# Patient Record
Sex: Male | Born: 1945 | Race: White | Hispanic: No | Marital: Married | Smoking: Never smoker
Health system: Southern US, Community
[De-identification: ages and names within clinical notes are randomized; demographics above are authoritative.]

## PROBLEM LIST (undated history)

## (undated) DIAGNOSIS — J45909 Unspecified asthma, uncomplicated: Secondary | ICD-10-CM

## (undated) DIAGNOSIS — E119 Type 2 diabetes mellitus without complications: Secondary | ICD-10-CM

## (undated) DIAGNOSIS — J189 Pneumonia, unspecified organism: Secondary | ICD-10-CM

## (undated) DIAGNOSIS — M79606 Pain in leg, unspecified: Secondary | ICD-10-CM

## (undated) DIAGNOSIS — Z8639 Personal history of other endocrine, nutritional and metabolic disease: Secondary | ICD-10-CM

## (undated) DIAGNOSIS — E7800 Pure hypercholesterolemia, unspecified: Secondary | ICD-10-CM

## (undated) DIAGNOSIS — Z9889 Other specified postprocedural states: Secondary | ICD-10-CM

## (undated) DIAGNOSIS — I10 Essential (primary) hypertension: Secondary | ICD-10-CM

## (undated) DIAGNOSIS — K219 Gastro-esophageal reflux disease without esophagitis: Secondary | ICD-10-CM

## (undated) HISTORY — DX: Other specified postprocedural states: Z98.890

## (undated) HISTORY — PX: LUNG SURGERY: SHX703

## (undated) HISTORY — PX: FRACTURE SURGERY: SHX138

## (undated) HISTORY — DX: Pain in leg, unspecified: M79.606

## (undated) HISTORY — DX: Pure hypercholesterolemia, unspecified: E78.00

## (undated) HISTORY — PX: COLONOSCOPY: SHX174

---

## 2010-04-17 DIAGNOSIS — E7800 Pure hypercholesterolemia, unspecified: Secondary | ICD-10-CM | POA: Insufficient documentation

## 2010-04-17 DIAGNOSIS — I10 Essential (primary) hypertension: Secondary | ICD-10-CM | POA: Insufficient documentation

## 2011-06-16 DIAGNOSIS — E1165 Type 2 diabetes mellitus with hyperglycemia: Secondary | ICD-10-CM | POA: Insufficient documentation

## 2013-01-12 DIAGNOSIS — J45909 Unspecified asthma, uncomplicated: Secondary | ICD-10-CM | POA: Insufficient documentation

## 2013-01-12 DIAGNOSIS — J869 Pyothorax without fistula: Secondary | ICD-10-CM | POA: Insufficient documentation

## 2013-09-06 ENCOUNTER — Encounter (HOSPITAL_COMMUNITY): Payer: Self-pay | Admitting: Pharmacy Technician

## 2013-09-08 ENCOUNTER — Encounter (HOSPITAL_COMMUNITY)
Admission: RE | Admit: 2013-09-08 | Discharge: 2013-09-08 | Disposition: A | Discharge status: Home / Self Care | Payer: Medicare Other | Source: Ambulatory Visit | Attending: Orthopedic Surgery | Admitting: Orthopedic Surgery

## 2013-09-08 ENCOUNTER — Ambulatory Visit (HOSPITAL_COMMUNITY)
Admission: RE | Admit: 2013-09-08 | Discharge: 2013-09-08 | Disposition: A | Discharge status: Home / Self Care | Payer: Medicare Other | Source: Ambulatory Visit | Attending: Orthopedic Surgery | Admitting: Orthopedic Surgery

## 2013-09-08 ENCOUNTER — Encounter (HOSPITAL_COMMUNITY): Payer: Self-pay

## 2013-09-08 DIAGNOSIS — Z01812 Encounter for preprocedural laboratory examination: Secondary | ICD-10-CM | POA: Insufficient documentation

## 2013-09-08 HISTORY — DX: Gastro-esophageal reflux disease without esophagitis: K21.9

## 2013-09-08 HISTORY — DX: Personal history of other endocrine, nutritional and metabolic disease: Z86.39

## 2013-09-08 HISTORY — DX: Essential (primary) hypertension: I10

## 2013-09-08 HISTORY — DX: Pneumonia, unspecified organism: J18.9

## 2013-09-08 HISTORY — DX: Unspecified asthma, uncomplicated: J45.909

## 2013-09-08 HISTORY — DX: Type 2 diabetes mellitus without complications: E11.9

## 2013-09-08 LAB — CBC
HCT: 37.4 % — ABNORMAL LOW (ref 39.0–52.0)
Hemoglobin: 12.9 g/dL — ABNORMAL LOW (ref 13.0–17.0)
MCH: 29.7 pg (ref 26.0–34.0)
MCHC: 34.5 g/dL (ref 30.0–36.0)
MCV: 86.2 fL (ref 78.0–100.0)
PLATELETS: 205 10*3/uL (ref 150–400)
RBC: 4.34 MIL/uL (ref 4.22–5.81)
RDW: 12.5 % (ref 11.5–15.5)
WBC: 9 10*3/uL (ref 4.0–10.5)

## 2013-09-08 LAB — BASIC METABOLIC PANEL
BUN: 19 mg/dL (ref 6–23)
CALCIUM: 9.2 mg/dL (ref 8.4–10.5)
CO2: 25 meq/L (ref 19–32)
Chloride: 99 mEq/L (ref 96–112)
Creatinine, Ser: 1.12 mg/dL (ref 0.50–1.35)
GFR calc Af Amer: 76 mL/min — ABNORMAL LOW (ref >90)
GFR, EST NON AFRICAN AMERICAN: 66 mL/min — AB (ref >90)
Glucose, Bld: 105 mg/dL — ABNORMAL HIGH (ref 70–99)
Potassium: 4.2 mEq/L (ref 3.7–5.3)
SODIUM: 139 meq/L (ref 137–147)

## 2013-09-08 LAB — SURGICAL PCR SCREEN
MRSA, PCR: NEGATIVE
Staphylococcus aureus: NEGATIVE

## 2013-09-08 LAB — TYPE AND SCREEN
ABO/RH(D): O POS
ANTIBODY SCREEN: NEGATIVE

## 2013-09-08 LAB — ABO/RH: ABO/RH(D): O POS

## 2013-09-08 NOTE — Pre-Procedure Instructions (Signed)
Jordan RoughDonald A West  09/08/2013   Your procedure is scheduled on: Wednesday, September 14, 2013 at 8:30 AM  Report to [facility] (use Main Entrance "A'') at 6:30 AM.  Call this number if you have problems the morning of surgery: [phone]   Remember:   Do not eat food or drink liquids after midnight.   Take these medicines the morning of surgery with A SIP OF WATER: omeprazole (PRILOSEC),     Fluticasone-Salmeterol (ADVAIR), if needed: HYDROcodone-acetaminophen (NORCO/VICODIN for pain,  albuterol (PROVENTIL HFA;VENTOLIN HFA) for wheezing or shortness of breath ( bring inhaler with you on day of procedure. Pt to bring brace  Do not wear jewelry, make-up or nail polish.  Do not wear lotions, powders, or perfumes. You may wear deodorant.  Do not shave 48 hours prior to surgery. Men may shave face and neck.  Do not bring valuables to the hospital.  [facility] is not responsible for any belongings or valuables.               Contacts, dentures or bridgework may not be worn into surgery.  Leave suitcase in the car. After surgery it may be brought to your room.  For patients admitted to the hospital, discharge time is determined by your treatment team.               Patients discharged the day of surgery will not be allowed to drive home.  Name and phone number of your driver:   Special Instructions:  Special Instructions:Special Instructions: [facility] - Preparing for [facility]  Before surgery, you can play an important role.  Because skin is not sterile, your skin needs to be as free of germs as possible.  You can reduce the number of germs on you skin by washing with CHG (chlorahexidine gluconate) soap before surgery.  CHG is an antiseptic cleaner which kills germs and bonds with the skin to continue killing germs even after washing.  Please DO NOT use if you have an allergy to CHG or antibacterial soaps.  If your skin becomes reddened/irritated stop using the CHG and inform your nurse  when you arrive at Short Stay.  Do not shave (including legs and underarms) for at least 48 hours prior to the first CHG shower.  You may shave your face.  Please follow these instructions carefully:   1.  Shower with CHG Soap the night before surgery and the morning of Surgery.  2.  If you choose to wash your hair, wash your hair first as usual with your normal shampoo.  3.  After you shampoo, rinse your hair and body thoroughly to remove the Shampoo.  4.  Use CHG as you would any other liquid soap.  You can apply chg directly  to the skin and wash gently with scrungie or a clean washcloth.  5.  Apply the CHG Soap to your body ONLY FROM THE NECK DOWN.  Do not use on open wounds or open sores.  Avoid contact with your eyes, ears, mouth and genitals (private parts).  Wash genitals (private parts) with your normal soap.  6.  Wash thoroughly, paying special attention to the area where your surgery will be performed.  7.  Thoroughly rinse your body with warm water from the neck down.  8.  DO NOT shower/wash with your normal soap after using and rinsing off the CHG Soap.  9.  Pat yourself dry with a clean towel.  10.  Wear clean pajamas.            11.  Place clean sheets on your bed the night of your first shower and do not sleep with pets.  Day of Surgery  Do not apply any lotions the morning of surgery.  Please wear clean clothes to the hospital/surgery center.   Please read over the following fact sheets that you were given: Pain Booklet, Coughing and Deep Breathing, Blood Transfusion Information, MRSA Information and Surgical Site Infection Prevention

## 2013-09-12 NOTE — Progress Notes (Signed)
Anesthesia Chart Review:  Patient is a 68 year old male scheduled for posterior decompression L4-S1, fusion L5-S1 on 09/14/13 by Dr. Shon BatonBrooks.  History includes hypertension, diabetes mellitus type 2, hypercholesterolemia, asthma, GERD, lung surgery (not specified).    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  Subsequently, patient was referred by Dr. Dinah BeersAlison Guptill for preoperative cardiology clearance.  He was ssen by Dr. Duard BradyAndrew Kronenberg on [facility]05/[facility].  According to Dr. Madelon LipsKonenberg's note, "he has no unstable signs or symptoms of angina or congestive heart failure. He has a good functional capacity greater than four mets. He has no cardiovascular contraindication to his back surgery."  He did order an echo which was done on 09/05/13 and showed: Normal left ventricular size, wall thickness, systolic function with no obvious regional wall motion abnormalities. Ejection fraction is visually estimated at 55-60%. Abnormal relaxation filling pattern of the left ventricle for age (stage I diastolic dysfunction). Trace mitral, tricuspid, and pulmonic regurgitation.  [facility]  He did have [facility] chest CT with [facility] 04/[facility]/2013 that showed continued further resolution of the posterior left lower lobe process with some minimal residual atelectasis or linear scar in the posterior left lower lobe. No new or progressive findings.  Preoperative labs noted.  He has been cleared by cardiology. If no acute changes then plan to proceed.  Velna Ochsllison Zelenak, PA-C [facility]/Anesthesiology Phone [phone] 09/12/2013 2:15 PM

## 2013-09-13 MED ORDER — CEFAZOLIN SODIUM-DEXTROSE 2-3 GM-% IV SOLR
2.0000 g | INTRAVENOUS | Status: AC
  Administered 2013-09-14 (×2): 2 g via INTRAVENOUS
  Filled 2013-09-13: qty 50

## 2013-09-13 MED ORDER — DEXAMETHASONE SODIUM PHOSPHATE 4 MG/ML IJ SOLN
4.0000 mg | Freq: Once | INTRAMUSCULAR | Status: AC
  Administered 2013-09-14: 4 mg via INTRAVENOUS
  Filled 2013-09-13: qty 1

## 2013-09-13 MED ORDER — ACETAMINOPHEN 10 MG/ML IV SOLN
1000.0000 mg | Freq: Four times a day (QID) | INTRAVENOUS | Status: DC
  Administered 2013-09-14: 1000 mg via INTRAVENOUS

## 2013-09-14 ENCOUNTER — Inpatient Hospital Stay (HOSPITAL_COMMUNITY)
Admission: RE | Admit: 2013-09-14 | Discharge: 2013-09-17 | DRG: 460 | Disposition: A | Discharge status: Home Health | Payer: Medicare Other | Source: Ambulatory Visit | Attending: Orthopedic Surgery | Admitting: Orthopedic Surgery

## 2013-09-14 ENCOUNTER — Encounter (HOSPITAL_COMMUNITY)
Admission: RE | Disposition: A | Discharge status: Home Health | Payer: Medicare Other | Source: Ambulatory Visit | Attending: Orthopedic Surgery

## 2013-09-14 ENCOUNTER — Inpatient Hospital Stay (HOSPITAL_COMMUNITY): Payer: Medicare Other | Admitting: Certified Registered Nurse Anesthetist

## 2013-09-14 ENCOUNTER — Inpatient Hospital Stay (HOSPITAL_COMMUNITY): Payer: Medicare Other

## 2013-09-14 ENCOUNTER — Encounter (HOSPITAL_COMMUNITY): Payer: Medicare Other | Admitting: Vascular Surgery

## 2013-09-14 ENCOUNTER — Encounter (HOSPITAL_COMMUNITY): Payer: Self-pay | Admitting: *Deleted

## 2013-09-14 DIAGNOSIS — Z8249 Family history of ischemic heart disease and other diseases of the circulatory system: Secondary | ICD-10-CM

## 2013-09-14 DIAGNOSIS — M51379 Other intervertebral disc degeneration, lumbosacral region without mention of lumbar back pain or lower extremity pain: Secondary | ICD-10-CM | POA: Diagnosis present

## 2013-09-14 DIAGNOSIS — J45909 Unspecified asthma, uncomplicated: Secondary | ICD-10-CM | POA: Diagnosis present

## 2013-09-14 DIAGNOSIS — E119 Type 2 diabetes mellitus without complications: Secondary | ICD-10-CM | POA: Diagnosis present

## 2013-09-14 DIAGNOSIS — I10 Essential (primary) hypertension: Secondary | ICD-10-CM | POA: Diagnosis present

## 2013-09-14 DIAGNOSIS — Z981 Arthrodesis status: Secondary | ICD-10-CM

## 2013-09-14 DIAGNOSIS — M5137 Other intervertebral disc degeneration, lumbosacral region: Secondary | ICD-10-CM | POA: Diagnosis present

## 2013-09-14 DIAGNOSIS — M4310 Spondylolisthesis, site unspecified: Principal | ICD-10-CM | POA: Diagnosis present

## 2013-09-14 DIAGNOSIS — Z833 Family history of diabetes mellitus: Secondary | ICD-10-CM

## 2013-09-14 DIAGNOSIS — K219 Gastro-esophageal reflux disease without esophagitis: Secondary | ICD-10-CM | POA: Diagnosis present

## 2013-09-14 DIAGNOSIS — M549 Dorsalgia, unspecified: Secondary | ICD-10-CM | POA: Diagnosis present

## 2013-09-14 DIAGNOSIS — M216X9 Other acquired deformities of unspecified foot: Secondary | ICD-10-CM | POA: Diagnosis present

## 2013-09-14 HISTORY — PX: DECOMPRESSIVE LUMBAR LAMINECTOMY LEVEL 2: SHX5792

## 2013-09-14 LAB — GLUCOSE, CAPILLARY
GLUCOSE-CAPILLARY: 163 mg/dL — AB (ref 70–99)
GLUCOSE-CAPILLARY: 173 mg/dL — AB (ref 70–99)
GLUCOSE-CAPILLARY: 143 mg/dL — AB (ref 70–99)
Glucose-Capillary: 143 mg/dL — ABNORMAL HIGH (ref 70–99)

## 2013-09-14 LAB — HEMOGLOBIN A1C
Hgb A1c MFr Bld: 7.3 % — ABNORMAL HIGH (ref <5.7)
Mean Plasma Glucose: 163 mg/dL — ABNORMAL HIGH (ref <117)

## 2013-09-14 SURGERY — DECOMPRESSIVE LUMBAR LAMINECTOMY LEVEL 2
Anesthesia: General | Site: Back

## 2013-09-14 MED ORDER — ONDANSETRON HCL 4 MG/2ML IJ SOLN
4.0000 mg | Freq: Four times a day (QID) | INTRAMUSCULAR | Status: DC | PRN
  Administered 2013-09-15: 4 mg via INTRAVENOUS
  Filled 2013-09-14: qty 2

## 2013-09-14 MED ORDER — ONDANSETRON HCL 4 MG/2ML IJ SOLN: 4.0000 mg | Freq: Four times a day (QID) | INTRAMUSCULAR | Status: DC | PRN

## 2013-09-14 MED ORDER — LACTATED RINGERS IV SOLN
INTRAVENOUS | Status: DC | PRN
  Administered 2013-09-14 (×4): via INTRAVENOUS

## 2013-09-14 MED ORDER — HYDROMORPHONE HCL PF 1 MG/ML IJ SOLN
INTRAMUSCULAR | Status: AC
  Filled 2013-09-14: qty 1

## 2013-09-14 MED ORDER — FENTANYL CITRATE 0.05 MG/ML IJ SOLN
INTRAMUSCULAR | Status: AC
  Filled 2013-09-14: qty 5

## 2013-09-14 MED ORDER — PHENYLEPHRINE HCL 10 MG/ML IJ SOLN
10.0000 mg | INTRAMUSCULAR | Status: DC | PRN
  Administered 2013-09-14: 40 ug via INTRAVENOUS

## 2013-09-14 MED ORDER — PHENYLEPHRINE HCL 10 MG/ML IJ SOLN
INTRAMUSCULAR | Status: DC | PRN
Start: 2013-09-14 — End: 2013-09-14
  Administered 2013-09-14: 80 ug via INTRAVENOUS

## 2013-09-14 MED ORDER — HEMOSTATIC AGENTS (NO CHARGE) OPTIME
TOPICAL | Status: DC | PRN
  Administered 2013-09-14: 1 via TOPICAL

## 2013-09-14 MED ORDER — LIDOCAINE HCL (CARDIAC) 20 MG/ML IV SOLN
INTRAVENOUS | Status: AC
  Filled 2013-09-14: qty 5

## 2013-09-14 MED ORDER — DIPHENHYDRAMINE HCL 50 MG/ML IJ SOLN: 12.5000 mg | Freq: Four times a day (QID) | INTRAMUSCULAR | Status: DC | PRN

## 2013-09-14 MED ORDER — ONDANSETRON HCL 4 MG/2ML IJ SOLN
INTRAMUSCULAR | Status: DC | PRN
  Administered 2013-09-14: 4 mg via INTRAVENOUS

## 2013-09-14 MED ORDER — PROPOFOL 10 MG/ML IV BOLUS
INTRAVENOUS | Status: DC | PRN
  Administered 2013-09-14: 180 mg via INTRAVENOUS

## 2013-09-14 MED ORDER — ONDANSETRON HCL 4 MG/2ML IJ SOLN: 4.0000 mg | INTRAMUSCULAR | Status: DC | PRN

## 2013-09-14 MED ORDER — PHENYLEPHRINE HCL 10 MG/ML IJ SOLN
10.0000 mg | INTRAVENOUS | Status: DC | PRN
  Administered 2013-09-14: 14:00:00 via INTRAVENOUS
  Administered 2013-09-14: 40 ug/min via INTRAVENOUS

## 2013-09-14 MED ORDER — SODIUM CHLORIDE 0.9 % IV SOLN: 250.0000 mL | INTRAVENOUS | Status: DC

## 2013-09-14 MED ORDER — MIDAZOLAM HCL 5 MG/5ML IJ SOLN
INTRAMUSCULAR | Status: DC | PRN
  Administered 2013-09-14: 2 mg via INTRAVENOUS

## 2013-09-14 MED ORDER — DOCUSATE SODIUM 100 MG PO CAPS
100.0000 mg | ORAL_CAPSULE | Freq: Two times a day (BID) | ORAL | Status: DC
  Administered 2013-09-14 – 2013-09-17 (×6): 100 mg via ORAL
  Filled 2013-09-14 (×8): qty 1

## 2013-09-14 MED ORDER — GLYCOPYRROLATE 0.2 MG/ML IJ SOLN
INTRAMUSCULAR | Status: DC | PRN
  Administered 2013-09-14: 0.6 mg via INTRAVENOUS

## 2013-09-14 MED ORDER — ALBUMIN HUMAN 5 % IV SOLN
INTRAVENOUS | Status: DC | PRN
  Administered 2013-09-14: 12:00:00 via INTRAVENOUS

## 2013-09-14 MED ORDER — PHENYLEPHRINE HCL 10 MG/ML IJ SOLN
INTRAMUSCULAR | Status: AC
  Filled 2013-09-14: qty 1

## 2013-09-14 MED ORDER — METHOCARBAMOL 500 MG PO TABS
500.0000 mg | ORAL_TABLET | Freq: Four times a day (QID) | ORAL | Status: DC | PRN
  Administered 2013-09-15: 500 mg via ORAL
  Filled 2013-09-14: qty 1

## 2013-09-14 MED ORDER — SUCCINYLCHOLINE CHLORIDE 20 MG/ML IJ SOLN
INTRAMUSCULAR | Status: DC | PRN
  Administered 2013-09-14: 120 mg via INTRAVENOUS

## 2013-09-14 MED ORDER — OXYCODONE HCL 5 MG PO TABS
ORAL_TABLET | ORAL | Status: AC
  Filled 2013-09-14: qty 1

## 2013-09-14 MED ORDER — BUPIVACAINE-EPINEPHRINE (PF) 0.25% -1:200000 IJ SOLN
INTRAMUSCULAR | Status: AC
  Filled 2013-09-14: qty 30

## 2013-09-14 MED ORDER — ROCURONIUM BROMIDE 50 MG/5ML IV SOLN
INTRAVENOUS | Status: AC
  Filled 2013-09-14: qty 1

## 2013-09-14 MED ORDER — OXYCODONE HCL 5 MG/5ML PO SOLN: 5.0000 mg | Freq: Once | ORAL | Status: AC | PRN

## 2013-09-14 MED ORDER — ROCURONIUM BROMIDE 100 MG/10ML IV SOLN
INTRAVENOUS | Status: DC | PRN
  Administered 2013-09-14: 10 mg via INTRAVENOUS
  Administered 2013-09-14: 50 mg via INTRAVENOUS

## 2013-09-14 MED ORDER — THROMBIN 5000 UNITS EX SOLR
CUTANEOUS | Status: AC
  Filled 2013-09-14: qty 5000

## 2013-09-14 MED ORDER — HEMOSTATIC AGENTS (NO CHARGE) OPTIME
TOPICAL | Status: DC | PRN
  Administered 2013-09-14 (×2): 1 via TOPICAL

## 2013-09-14 MED ORDER — LIDOCAINE HCL (CARDIAC) 20 MG/ML IV SOLN
INTRAVENOUS | Status: DC | PRN
  Administered 2013-09-14: 80 mg via INTRAVENOUS

## 2013-09-14 MED ORDER — CEFAZOLIN SODIUM 1-5 GM-% IV SOLN
1.0000 g | Freq: Three times a day (TID) | INTRAVENOUS | Status: AC
  Administered 2013-09-14 – 2013-09-15 (×2): 1 g via INTRAVENOUS
  Filled 2013-09-14 (×2): qty 50

## 2013-09-14 MED ORDER — ACETAMINOPHEN 10 MG/ML IV SOLN: 1000.0000 mg | Freq: Once | INTRAVENOUS | Status: DC

## 2013-09-14 MED ORDER — VANCOMYCIN HCL 500 MG IV SOLR
INTRAVENOUS | Status: AC
  Filled 2013-09-14: qty 500

## 2013-09-14 MED ORDER — METFORMIN HCL 500 MG PO TABS
1000.0000 mg | ORAL_TABLET | Freq: Two times a day (BID) | ORAL | Status: DC
  Administered 2013-09-15 – 2013-09-17 (×5): 1000 mg via ORAL
  Filled 2013-09-14 (×8): qty 2

## 2013-09-14 MED ORDER — PROPOFOL 10 MG/ML IV BOLUS
INTRAVENOUS | Status: AC
  Filled 2013-09-14: qty 20

## 2013-09-14 MED ORDER — FLEET ENEMA 7-19 GM/118ML RE ENEM: 1.0000 | ENEMA | Freq: Once | RECTAL | Status: AC | PRN

## 2013-09-14 MED ORDER — MOMETASONE FURO-FORMOTEROL FUM 100-5 MCG/ACT IN AERO
2.0000 | INHALATION_SPRAY | Freq: Two times a day (BID) | RESPIRATORY_TRACT | Status: DC
  Administered 2013-09-14 – 2013-09-17 (×5): 2 via RESPIRATORY_TRACT
  Filled 2013-09-14: qty 8.8

## 2013-09-14 MED ORDER — 0.9 % SODIUM CHLORIDE (POUR BTL) OPTIME
TOPICAL | Status: DC | PRN
  Administered 2013-09-14 (×2): 1000 mL

## 2013-09-14 MED ORDER — MORPHINE SULFATE (PF) 1 MG/ML IV SOLN
INTRAVENOUS | Status: DC
  Administered 2013-09-14: 5 mg via INTRAVENOUS
  Administered 2013-09-14: 17:00:00 via INTRAVENOUS
  Administered 2013-09-15: 8 mg via INTRAVENOUS
  Administered 2013-09-15: 04:00:00 via INTRAVENOUS
  Administered 2013-09-15: 8 mg via INTRAVENOUS
  Administered 2013-09-15: 10 mg via INTRAVENOUS
  Filled 2013-09-14: qty 25

## 2013-09-14 MED ORDER — ACETAMINOPHEN 10 MG/ML IV SOLN
1000.0000 mg | Freq: Four times a day (QID) | INTRAVENOUS | Status: AC
  Administered 2013-09-14 – 2013-09-15 (×4): 1000 mg via INTRAVENOUS
  Filled 2013-09-14 (×4): qty 100

## 2013-09-14 MED ORDER — SODIUM CHLORIDE 0.9 % IJ SOLN: 3.0000 mL | INTRAMUSCULAR | Status: DC | PRN

## 2013-09-14 MED ORDER — LACTATED RINGERS IV SOLN: INTRAVENOUS | Status: DC

## 2013-09-14 MED ORDER — LOSARTAN POTASSIUM 50 MG PO TABS
50.0000 mg | ORAL_TABLET | Freq: Every day | ORAL | Status: DC
  Administered 2013-09-14 – 2013-09-17 (×3): 50 mg via ORAL
  Filled 2013-09-14 (×4): qty 1

## 2013-09-14 MED ORDER — BUPIVACAINE-EPINEPHRINE PF 0.25-1:200000 % IJ SOLN
INTRAMUSCULAR | Status: DC | PRN
  Administered 2013-09-14: 10 mL

## 2013-09-14 MED ORDER — MENTHOL 3 MG MT LOZG: 1.0000 | LOZENGE | OROMUCOSAL | Status: DC | PRN

## 2013-09-14 MED ORDER — ONDANSETRON HCL 4 MG/2ML IJ SOLN
INTRAMUSCULAR | Status: AC
  Filled 2013-09-14: qty 2

## 2013-09-14 MED ORDER — SODIUM CHLORIDE 0.9 % IJ SOLN
INTRAMUSCULAR | Status: AC
  Filled 2013-09-14: qty 10

## 2013-09-14 MED ORDER — PHENOL 1.4 % MT LIQD: 1.0000 | OROMUCOSAL | Status: DC | PRN

## 2013-09-14 MED ORDER — FENTANYL CITRATE 0.05 MG/ML IJ SOLN
INTRAMUSCULAR | Status: DC | PRN
  Administered 2013-09-14: 50 ug via INTRAVENOUS
  Administered 2013-09-14: 25 ug via INTRAVENOUS
  Administered 2013-09-14 (×3): 50 ug via INTRAVENOUS
  Administered 2013-09-14: 100 ug via INTRAVENOUS
  Administered 2013-09-14 (×2): 50 ug via INTRAVENOUS
  Administered 2013-09-14: 25 ug via INTRAVENOUS
  Administered 2013-09-14: 50 ug via INTRAVENOUS

## 2013-09-14 MED ORDER — OXYCODONE HCL 5 MG PO TABS
5.0000 mg | ORAL_TABLET | Freq: Once | ORAL | Status: AC | PRN
  Administered 2013-09-14: 5 mg via ORAL

## 2013-09-14 MED ORDER — HYDROMORPHONE HCL PF 1 MG/ML IJ SOLN
0.2500 mg | INTRAMUSCULAR | Status: DC | PRN
  Administered 2013-09-14: 0.5 mg via INTRAVENOUS

## 2013-09-14 MED ORDER — MORPHINE SULFATE (PF) 1 MG/ML IV SOLN
INTRAVENOUS | Status: AC
  Filled 2013-09-14: qty 25

## 2013-09-14 MED ORDER — DEXTROSE 5 % IV SOLN
500.0000 mg | Freq: Four times a day (QID) | INTRAVENOUS | Status: DC | PRN
  Filled 2013-09-14: qty 5

## 2013-09-14 MED ORDER — VANCOMYCIN HCL 500 MG IV SOLR
INTRAVENOUS | Status: DC | PRN
  Administered 2013-09-14: 500 mg

## 2013-09-14 MED ORDER — NEOSTIGMINE METHYLSULFATE 1 MG/ML IJ SOLN
INTRAMUSCULAR | Status: DC | PRN
  Administered 2013-09-14: 4 mg via INTRAVENOUS

## 2013-09-14 MED ORDER — SODIUM CHLORIDE 0.9 % IJ SOLN: 9.0000 mL | INTRAMUSCULAR | Status: DC | PRN

## 2013-09-14 MED ORDER — EPHEDRINE SULFATE 50 MG/ML IJ SOLN
INTRAMUSCULAR | Status: AC
  Filled 2013-09-14: qty 1

## 2013-09-14 MED ORDER — DEXAMETHASONE SODIUM PHOSPHATE 4 MG/ML IJ SOLN
INTRAMUSCULAR | Status: AC
  Filled 2013-09-14: qty 1

## 2013-09-14 MED ORDER — NALOXONE HCL 0.4 MG/ML IJ SOLN: 0.4000 mg | INTRAMUSCULAR | Status: DC | PRN

## 2013-09-14 MED ORDER — SODIUM CHLORIDE 0.9 % IJ SOLN
3.0000 mL | Freq: Two times a day (BID) | INTRAMUSCULAR | Status: DC
  Administered 2013-09-14 – 2013-09-16 (×4): 3 mL via INTRAVENOUS

## 2013-09-14 MED ORDER — INSULIN ASPART 100 UNIT/ML ~~LOC~~ SOLN
0.0000 [IU] | SUBCUTANEOUS | Status: DC
  Administered 2013-09-14 – 2013-09-15 (×2): 2 [IU] via SUBCUTANEOUS

## 2013-09-14 MED ORDER — PANTOPRAZOLE SODIUM 40 MG PO TBEC
40.0000 mg | DELAYED_RELEASE_TABLET | Freq: Every day | ORAL | Status: DC
  Administered 2013-09-15 – 2013-09-17 (×3): 40 mg via ORAL
  Filled 2013-09-14 (×3): qty 1

## 2013-09-14 MED ORDER — ALBUTEROL SULFATE HFA 108 (90 BASE) MCG/ACT IN AERS: 1.0000 | INHALATION_SPRAY | Freq: Four times a day (QID) | RESPIRATORY_TRACT | Status: DC | PRN

## 2013-09-14 MED ORDER — PHENYLEPHRINE 40 MCG/ML (10ML) SYRINGE FOR IV PUSH (FOR BLOOD PRESSURE SUPPORT)
PREFILLED_SYRINGE | INTRAVENOUS | Status: AC
  Filled 2013-09-14: qty 10

## 2013-09-14 MED ORDER — MIDAZOLAM HCL 2 MG/2ML IJ SOLN
INTRAMUSCULAR | Status: AC
Start: 2013-09-14 — End: 2013-09-14
  Filled 2013-09-14: qty 2

## 2013-09-14 MED ORDER — SUCCINYLCHOLINE CHLORIDE 20 MG/ML IJ SOLN
INTRAMUSCULAR | Status: AC
  Filled 2013-09-14: qty 1

## 2013-09-14 MED ORDER — DIPHENHYDRAMINE HCL 12.5 MG/5ML PO ELIX: 12.5000 mg | ORAL_SOLUTION | Freq: Four times a day (QID) | ORAL | Status: DC | PRN

## 2013-09-14 MED ORDER — ALBUTEROL SULFATE (2.5 MG/3ML) 0.083% IN NEBU: 2.5000 mg | INHALATION_SOLUTION | Freq: Four times a day (QID) | RESPIRATORY_TRACT | Status: DC | PRN

## 2013-09-14 MED ORDER — THROMBIN 5000 UNITS EX SOLR
CUTANEOUS | Status: DC | PRN
  Administered 2013-09-14 (×2): 5000 [IU] via TOPICAL

## 2013-09-14 SURGICAL SUPPLY — 83 items
BANDAGE GAUZE ELAST BULKY 4 IN (GAUZE/BANDAGES/DRESSINGS) ×3 IMPLANT
BUR EGG ELITE 4.0 (BURR) ×2 IMPLANT
BUR EGG ELITE 4.0MM (BURR) ×1
CLIP NEUROVISION LG (CLIP) ×3 IMPLANT
CLOSURE STERI-STRIP 1/2X4 (GAUZE/BANDAGES/DRESSINGS) ×1
CLOSURE WOUND 1/2 X4 (GAUZE/BANDAGES/DRESSINGS)
CLSR STERI-STRIP ANTIMIC 1/2X4 (GAUZE/BANDAGES/DRESSINGS) ×2 IMPLANT
CONNECTOR ADJ CC 50-60MM (Screw) ×3 IMPLANT
CORDS BIPOLAR (ELECTRODE) ×3 IMPLANT
DRAPE C-ARM 42X72 X-RAY (DRAPES) ×3 IMPLANT
DRAPE C-ARMOR (DRAPES) ×3 IMPLANT
DRAPE POUCH INSTRU U-SHP 10X18 (DRAPES) ×3 IMPLANT
DRAPE SURG 17X11 SM STRL (DRAPES) ×3 IMPLANT
DRAPE U-SHAPE 47X51 STRL (DRAPES) ×3 IMPLANT
DRSG MEPILEX BORDER 4X4 (GAUZE/BANDAGES/DRESSINGS) ×3 IMPLANT
DRSG MEPILEX BORDER 4X8 (GAUZE/BANDAGES/DRESSINGS) ×3 IMPLANT
DURAPREP 26ML APPLICATOR (WOUND CARE) ×3 IMPLANT
ELECT BLADE 4.0 EZ CLEAN MEGAD (MISCELLANEOUS) ×3
ELECT CAUTERY BLADE 6.4 (BLADE) ×3 IMPLANT
ELECT REM PT RETURN 9FT ADLT (ELECTROSURGICAL) ×3
ELECTRODE BLDE 4.0 EZ CLN MEGD (MISCELLANEOUS) ×1 IMPLANT
ELECTRODE REM PT RTRN 9FT ADLT (ELECTROSURGICAL) ×1 IMPLANT
FLOSEAL (HEMOSTASIS) IMPLANT
GLOVE BIO SURGEON STRL SZ7 (GLOVE) ×12 IMPLANT
GLOVE BIOGEL PI IND STRL 6.5 (GLOVE) ×1 IMPLANT
GLOVE BIOGEL PI IND STRL 7.0 (GLOVE) ×10 IMPLANT
GLOVE BIOGEL PI IND STRL 8 (GLOVE) ×1 IMPLANT
GLOVE BIOGEL PI IND STRL 8.5 (GLOVE) ×1 IMPLANT
GLOVE BIOGEL PI INDICATOR 6.5 (GLOVE) ×2
GLOVE BIOGEL PI INDICATOR 7.0 (GLOVE) ×20
GLOVE BIOGEL PI INDICATOR 8 (GLOVE) ×2
GLOVE BIOGEL PI INDICATOR 8.5 (GLOVE) ×2
GLOVE ECLIPSE 6.5 STRL STRAW (GLOVE) ×6 IMPLANT
GLOVE ECLIPSE 8.5 STRL (GLOVE) ×3 IMPLANT
GLOVE ORTHO TXT STRL SZ7.5 (GLOVE) ×3 IMPLANT
GOWN STRL REUS W/ TWL LRG LVL3 (GOWN DISPOSABLE) ×7 IMPLANT
GOWN STRL REUS W/TWL 2XL LVL3 (GOWN DISPOSABLE) ×6 IMPLANT
GOWN STRL REUS W/TWL LRG LVL3 (GOWN DISPOSABLE) ×14
GUIDEWIRE NITINOL BEVEL TIP (WIRE) ×9 IMPLANT
KIT BASIN OR (CUSTOM PROCEDURE TRAY) ×3 IMPLANT
KIT NEEDLE NVM5 EMG ELECT (KITS) ×1 IMPLANT
KIT NEEDLE NVM5 EMG ELECTRODE (KITS) ×2
KIT STIMULAN RAPID CURE  10CC (Orthopedic Implant) ×2 IMPLANT
KIT STIMULAN RAPID CURE 10CC (Orthopedic Implant) ×1 IMPLANT
LIGHT SOURCE ANGLE TIP STR 7FT (MISCELLANEOUS) ×3 IMPLANT
MAS TLIF HOOP SHIM (KITS) ×3 IMPLANT
NEEDLE 22X1 1/2 (OR ONLY) (NEEDLE) ×3 IMPLANT
NEEDLE SPNL 18GX3.5 QUINCKE PK (NEEDLE) ×6 IMPLANT
NS IRRIG 1000ML POUR BTL (IV SOLUTION) ×3 IMPLANT
PACK LAMINECTOMY ORTHO (CUSTOM PROCEDURE TRAY) ×3 IMPLANT
PACK UNIVERSAL I (CUSTOM PROCEDURE TRAY) ×3 IMPLANT
PATTIES SURGICAL .5 X.5 (GAUZE/BANDAGES/DRESSINGS) ×3 IMPLANT
PATTIES SURGICAL .5 X1 (DISPOSABLE) ×9 IMPLANT
PRECEPT SHANK 6.5X45 (Neuro Prosthesis/Implant) ×12 IMPLANT
PRECEPT SHANK CANN 6.5X40 (Neuro Prosthesis/Implant) ×6 IMPLANT
PRECEPT TULIPS (Neuro Prosthesis/Implant) ×18 IMPLANT
PROBE BALL TIP NVM5 SNG USE (BALLOONS) ×3 IMPLANT
PUTTY DBX 5CC (Putty) ×3 IMPLANT
ROD 55MM (Rod) ×3 IMPLANT
ROD PREBENT TI 60MM (Rod) ×3 IMPLANT
SCREW PRECEPT SET (Screw) ×18 IMPLANT
SCREW SHANK PRECEPT MOD 7.5X40 (Screw) ×3 IMPLANT
SHEET CONFORM 45LX20WX7H (Bone Implant) ×3 IMPLANT
SPONGE LAP 4X18 X RAY DECT (DISPOSABLE) ×21 IMPLANT
SPONGE SURGIFOAM ABS GEL 100 (HEMOSTASIS) ×3 IMPLANT
STAPLER VISISTAT 35W (STAPLE) IMPLANT
STRIP CLOSURE SKIN 1/2X4 (GAUZE/BANDAGES/DRESSINGS) IMPLANT
SURGIFLO TRUKIT (HEMOSTASIS) ×6 IMPLANT
SUT MON AB 3-0 SH 27 (SUTURE) ×2
SUT MON AB 3-0 SH27 (SUTURE) ×1 IMPLANT
SUT VIC AB 1 CT1 27 (SUTURE) ×2
SUT VIC AB 1 CT1 27XBRD ANTBC (SUTURE) ×1 IMPLANT
SUT VIC AB 1 CTX 36 (SUTURE) ×2
SUT VIC AB 1 CTX36XBRD ANBCTR (SUTURE) ×1 IMPLANT
SUT VIC AB 2-0 CT1 18 (SUTURE) ×3 IMPLANT
SUT VICRYL 0 UR6 27IN ABS (SUTURE) ×3 IMPLANT
SYR BULB IRRIGATION 50ML (SYRINGE) ×3 IMPLANT
SYR CONTROL 10ML LL (SYRINGE) ×6 IMPLANT
TLIF XLRG 11MM (Neuro Prosthesis/Implant) ×3 IMPLANT
TOWEL OR 17X26 10 PK STRL BLUE (TOWEL DISPOSABLE) ×6 IMPLANT
TRAY FOLEY CATH 16FRSI W/METER (SET/KITS/TRAYS/PACK) ×3 IMPLANT
WATER STERILE IRR 1000ML POUR (IV SOLUTION) ×3 IMPLANT
YANKAUER SUCT BULB TIP NO VENT (SUCTIONS) ×3 IMPLANT

## 2013-09-14 NOTE — H&P (Signed)
History of Present Illness  The patient is a 68 year old male who comes in today for a preoperative History and Physical. The patient is scheduled for a L4-5 decompression, TLIF L5-S1 to be performed by Dr. Debria Garretahari D. Shon BatonBrooks, [facility] on 09-14-13 . Please see the hospital record for complete dictated history and physical.  Jordan West is a 68 year old white male who has a history of lumbar degenerative disk disease with low back pain and leg symptoms who returns to the office today. He is wanting to proceed with an L4-S1 decompression and L5-S1 TLIF as scheduled. We received preoperative medical and cardiac clearance.   Allergies No Known Drug Allergies. 08/10/2013    Family History Diabetes Mellitus. Brother, Father, Mother, Sister. Heart Disease. Father. Hypertension. Father. Bleeding disorder. Mother. Cancer. Mother. Congestive Heart Failure. Father.    Social History Not under pain contract No history of drug/alcohol rehab Number of flights of stairs before winded. 2-3 Tobacco use. Never smoker. 08/10/2013 Tobacco / smoke exposure. 08/10/2013: no Current work status. retired Copywriter, [employer]. 1 Exercise. Exercises weekly; does running / walking and team sport Never consumed alcohol. 08/10/2013: Never consumed alcohol Marital status. married    Medication History Ventolin HFA (108 (90 Base)MCG/ACT Aerosol Soln, Inhalation) Active. (prn) Triamcinolone Acetonide (0.1% Cream, External) Active. (prn face rash) Omeprazole (20MG  Capsule DR, Oral) Active. (qd) MetFORMIN HCl (1000MG  Tablet, Oral) Active. (bid) Losartan Potassium (50MG  Tablet, Oral) Active. (qd) Advair Diskus (250-50MCG/DOSE Aero Pow Br Act, Inhalation) Active. (bid) Medications Reconciled.    Past Surgical History Lung Surgery . left    Other Problems High blood pressure Diabetes Mellitus, Type II Asthma    Vitals 09/09/2013 9:15 AM Weight: 167.03 lb  Height: 68.5 in Body Surface Area: 1.91 m Body Mass Index: 25.03 kg/m Temp.: 98.2 F (Oral) BP: 125/78 (Sitting, Left Arm, Standard)     Objective Transcription  He is a pleasant white male. He is alert and oriented times three. He is in no acute distress. Head is normocephalic, atraumatic. PERRLA, EOMI. Lungs CTA bilaterally. No wheeze is noted. Heart RRR. No murmurs are heard. Abdomen is round and nondistended. NBS times four. Soft and nontender. He is neurologically intact. Skin is warm and dry. No increase in respiratory effort.  No shortness of breath or chest pain. The abdomen is soft and nontender. He has a well healed thoracotomy incision from where he had lung scraping for bacterial pneumonia, but that is healed. He has no shortness of breath. He has no incontinence of bowel and bladder. He has no real hip, knee, or ankle pain with joint range of motion, but he has 4 to 3+/5 left gastrocnemius strength, 4/5 EHL tibialis anterior strength. On the right side it is globally 5/5. He has diminished sensation to light touch in the 4-5 and S1 distribution on the left compared to the right. He has a negative straight leg raise test bilaterally. Negative femoral stretch test. He has 1+ hypoactive reflexes symmetrically in the lower extremities. No clonus. Negative Babinski test. Intact peripheral pulses. Compartment are soft and nontender. No obvious deformity or significant pain with palpation of the lumbar spine.    RADIOGRAPHS:  X rays today in my office demonstrate that he has an extra lumbar vertebra. The last full disc space is labeled as S1-S2 for the purposes of this dictation. 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 No other significant abnormalities other than  trace scoliosis.    On [facility], he has disc herniation at L4-5 and severe central and  lateral recess stenosis at L4-5 due to thickening and buckling of the ligamentum flavum and the central disc herniation. At L4-5 there is some reduction in the slip, but there is still moderate to severe stenosis.   Assessment & Plan  At this point the patient has chronic neurological deficit, (foot drop) on the left side and progressive neurological pain on the right.  Plans Transcription  The patient's treatment thus far has included a series of four spinal injections. He describes potentially facet blocks and transforaminal ESI, but I do not have the history to confirm it, but he does indicate that he had four nerve blocks during the course of the last year. He has had physical therapy. He has tried to remain physically active on his own, but all of this has failed. At this point in time more than likely he is going to require a surgical procedure.Patient with significant stenosis and nerve compression L4-S1.  Grade II slip at L5-S1.  Will require decompression and stabilization.  Discussed with patient and wife.  We have gone over the risks and benefits of surgery, which include infection, bleeding, nerve damage, death, stroke, paralysis, failure to heal, need for further surgery, ongoing or worse pain, loss of fixation, need for further surgery, CSF leak, loss of bowel or bladder control, ongoing or worse pain.  All questions addressed.   Will proceed with TLIF L5-S1 and decompression and possible fusion L4-5.

## 2013-09-14 NOTE — Anesthesia Preprocedure Evaluation (Addendum)
Anesthesia Evaluation  Patient identified by MRN, date of birth, ID band Patient awake    Reviewed: Allergy & Precautions, H&P , NPO status , Patient's Chart, lab work & pertinent test results  Airway Mallampati: II TM Distance: >3 FB Neck ROM: full    Dental  (+) Teeth Intact, Dental Advisory Given   Pulmonary asthma ,          Cardiovascular hypertension, Pt. on medications     Neuro/Psych    GI/Hepatic GERD-  Medicated and Controlled,  Endo/Other  diabetes, Type 2  Renal/GU      Musculoskeletal   Abdominal   Peds  Hematology   Anesthesia Other Findings   Reproductive/Obstetrics                          Anesthesia Physical Anesthesia Plan  ASA: II  Anesthesia Plan: General   Post-op Pain Management:    Induction: Intravenous  Airway Management Planned: Oral ETT  Additional Equipment:   Intra-op Plan:   Post-operative Plan: Extubation in OR  Informed Consent: I have reviewed the patients History and Physical, chart, labs and discussed the procedure including the risks, benefits and alternatives for the proposed anesthesia with the patient or authorized representative who has indicated his/her understanding and acceptance.   Dental advisory given  Plan Discussed with: CRNA, Anesthesiologist and Surgeon  Anesthesia Plan Comments:        Anesthesia Quick Evaluation

## 2013-09-14 NOTE — Transfer of Care (Signed)
Immediate Anesthesia Transfer of Care Note  Patient: Jordan West  Procedure(s) Performed: Procedure(s): Lumbar Four-Sacral One Decompression and Transforaminal Lumbar Four-Five Interbody and Lumbar Four-Sacral One Fusion (N/A)  Patient Location: PACU  Anesthesia Type:General  Level of Consciousness: awake, alert , oriented and sedated  Airway & Oxygen Therapy: Patient Spontanous Breathing and Patient connected to nasal cannula oxygen  Post-op Assessment: Report given to PACU RN, Post -op Vital signs reviewed and stable and Patient moving all extremities  Post vital signs: Reviewed and stable  Complications: No apparent anesthesia complications

## 2013-09-14 NOTE — Anesthesia Postprocedure Evaluation (Signed)
  Anesthesia Post-op Note  Patient: Jordan West  Procedure(s) Performed: Procedure(s): Lumbar Four-Sacral One Decompression and Transforaminal Lumbar Four-Five Interbody and Lumbar Four-Sacral One Fusion (N/A)  Patient Location: PACU  Anesthesia Type:General  Level of Consciousness: awake, alert  and oriented  Airway and Oxygen Therapy: Patient Spontanous Breathing and Patient connected to nasal cannula oxygen  Post-op Pain: mild  Post-op Assessment: Post-op Vital signs reviewed, Patient's Cardiovascular Status Stable, Respiratory Function Stable, Patent Airway and Pain level controlled  Post-op Vital Signs: stable  Last Vitals:  Filed Vitals:   09/14/13 1720  BP: 137/80  Pulse: 101  Temp: 36.9 C  Resp: 15    Complications: No apparent anesthesia complications

## 2013-09-14 NOTE — Brief Op Note (Signed)
Job Number:  [id number] Procedure: PSFI L4-S1 (TLIF L5/S1) Complications: none Condition stable

## 2013-09-14 NOTE — Anesthesia Procedure Notes (Signed)
Procedure Name: Intubation Date/Time: 09/14/2013 8:35 AM Performed by: Rogelia BogaMUELLER, Erial Fikes P Pre-anesthesia Checklist: Emergency Drugs available, Patient identified, Suction available, Patient being monitored and Timeout performed Patient Re-evaluated:Patient Re-evaluated prior to inductionOxygen Delivery Method: Circle system utilized Preoxygenation: Pre-oxygenation with 100% oxygen Intubation Type: IV induction Laryngoscope Size: Mac and 4 Grade View: Grade II Tube type: Oral Tube size: 7.5 mm Number of attempts: 1 Airway Equipment and Method: Stylet Secured at: 21 cm Tube secured with: Tape Dental Injury: Teeth and Oropharynx as per pre-operative assessment

## 2013-09-15 ENCOUNTER — Inpatient Hospital Stay (HOSPITAL_COMMUNITY): Payer: Medicare Other

## 2013-09-15 LAB — URINALYSIS, ROUTINE W REFLEX MICROSCOPIC
BILIRUBIN URINE: NEGATIVE
Glucose, UA: NEGATIVE mg/dL
HGB URINE DIPSTICK: NEGATIVE
Ketones, ur: NEGATIVE mg/dL
Leukocytes, UA: NEGATIVE
Nitrite: NEGATIVE
Protein, ur: NEGATIVE mg/dL
SPECIFIC GRAVITY, URINE: 1.022 (ref 1.005–1.030)
Urobilinogen, UA: 0.2 mg/dL (ref 0.0–1.0)
pH: 5 (ref 5.0–8.0)

## 2013-09-15 LAB — GLUCOSE, CAPILLARY
Glucose-Capillary: 105 mg/dL — ABNORMAL HIGH (ref 70–99)
Glucose-Capillary: 120 mg/dL — ABNORMAL HIGH (ref 70–99)
Glucose-Capillary: 130 mg/dL — ABNORMAL HIGH (ref 70–99)
Glucose-Capillary: 157 mg/dL — ABNORMAL HIGH (ref 70–99)
Glucose-Capillary: 173 mg/dL — ABNORMAL HIGH (ref 70–99)

## 2013-09-15 MED ORDER — ACETAMINOPHEN 325 MG PO TABS
650.0000 mg | ORAL_TABLET | Freq: Four times a day (QID) | ORAL | Status: DC | PRN
  Administered 2013-09-15 – 2013-09-17 (×4): 650 mg via ORAL
  Filled 2013-09-15 (×4): qty 2

## 2013-09-15 MED ORDER — INSULIN ASPART 100 UNIT/ML ~~LOC~~ SOLN
0.0000 [IU] | Freq: Three times a day (TID) | SUBCUTANEOUS | Status: DC
  Administered 2013-09-15 (×2): 3 [IU] via SUBCUTANEOUS
  Administered 2013-09-15 – 2013-09-16 (×3): 2 [IU] via SUBCUTANEOUS
  Administered 2013-09-16 – 2013-09-17 (×2): 3 [IU] via SUBCUTANEOUS

## 2013-09-15 MED ORDER — OXYCODONE HCL 5 MG PO TABS
5.0000 mg | ORAL_TABLET | ORAL | Status: DC | PRN
  Administered 2013-09-15 (×2): 5 mg via ORAL
  Administered 2013-09-16 – 2013-09-17 (×9): 10 mg via ORAL
  Filled 2013-09-15: qty 1
  Filled 2013-09-15 (×5): qty 2
  Filled 2013-09-15: qty 1
  Filled 2013-09-15 (×5): qty 2

## 2013-09-15 NOTE — Evaluation (Signed)
Physical Therapy Evaluation Patient Details Name: Jordan West A Soulliere MRN: [id number] DOB: 08/17/1945 Today's Date: 09/15/2013   History of Present Illness  pt presents with L4-S1 PLIF.    Clinical Impression  Pt nauseated with mobility.  RN made aware.  Anticpate good progress once nausea clears.  Will continue to follow.      Follow Up Recommendations Home health PT;Supervision/Assistance - 24 hour    Equipment Recommendations  Rolling walker with 5" wheels    Recommendations for Other Services       Precautions / Restrictions Precautions Precautions: Back;Fall Precaution Booklet Issued: Yes (comment) Required Braces or Orthoses: Spinal Brace Spinal Brace: Lumbar corset;Applied in sitting position Restrictions Weight Bearing Restrictions: No      Mobility  Bed Mobility Overal bed mobility: Needs Assistance Bed Mobility: Rolling;Sidelying to Sit Rolling: Min guard Sidelying to sit: Min assist       General bed mobility comments: cues for log roll and sequencing.    Transfers Overall transfer level: Needs assistance Equipment used: 1 person hand held assist Transfers: Sit to/from [employer]Stand;Stand Pivot Transfers Sit to Stand: Min assist Stand pivot transfers: Min assist       General transfer comment: pt nauseated and feeling a little dizzy.  Only SPT to chair.    Ambulation/Gait                Stairs            Wheelchair Mobility    Modified Rankin (Stroke Patients Only)       Balance                                             Pertinent Vitals/Pain 5-6/10 in back during mobility.  Premedicated.      Home Living Family/patient expects to be discharged to:: Private residence Living Arrangements: Spouse/significant other Available Help at Discharge: Family;Available 24 hours/day Type of Home: House Home Access: Stairs to enter Entrance Stairs-Rails: None Entrance Stairs-Number of Steps: 1 Home Layout: One level Home  Equipment: None      Prior Function Level of Independence: Independent               Hand Dominance        Extremity/Trunk Assessment   Upper Extremity Assessment: Defer to OT evaluation           Lower Extremity Assessment: Overall WFL for tasks assessed      Cervical / Trunk Assessment: Normal  Communication   Communication: No difficulties  Cognition Arousal/Alertness: Awake/alert Behavior During Therapy: WFL for tasks assessed/performed Overall Cognitive Status: Within Functional Limits for tasks assessed                      General Comments      Exercises        Assessment/Plan    PT Assessment Patient needs continued PT services  PT Diagnosis Difficulty walking   PT Problem List Decreased activity tolerance;Decreased balance;Decreased mobility;Decreased knowledge of use of DME;Decreased knowledge of precautions  PT Treatment Interventions DME instruction;Gait training;Stair training;Functional mobility training;Therapeutic activities;Therapeutic exercise;Balance training;Neuromuscular re-education;Patient/family education   PT Goals (Current goals can be found in the Care Plan section) Acute Rehab PT Goals Patient Stated Goal: Home PT Goal Formulation: With patient Time For Goal Achievement: 09/22/13 Potential to Achieve Goals: Good    Frequency Min 5X/week   Barriers  to discharge        Co-evaluation               End of Session Equipment Utilized During Treatment: Gait belt;Back brace Activity Tolerance:  (Limited by nausea.  ) Patient left: in chair;with call bell/phone within reach Nurse Communication: Mobility status (Nausea)         Time: [id number] PT Time Calculation (min): 38 min   Charges:   PT Evaluation $Initial PT Evaluation Tier I: 1 Procedure PT Treatments $Therapeutic Activity: 23-37 mins   PT G CodesSunny Schlein:          Talik Casique F Kelie Gainey, PT [phone] 09/15/2013, 10:07 AM

## 2013-09-15 NOTE — Op Note (Signed)
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  OPERATIVE PROCEDURE: 1. Gill decompression L4-L5, L5-S1 bilaterally. 2. Posterior segmental instrumentation with pedicle screw fixation L4-     L5, L5-S1. 3. Posterolateral arthrodesis with local bone plus contoured L4-L5, L5-     S1. 4. Complete diskectomy L5-S1 with removal of intervertebral disk and     implantation of biomechanical intervertebral device.  COMPLICATIONS:  None.  FIRST ASSISTANT:  Genene Churn. Barry Dienes, PA-C  HISTORY:  Asaph is a very pleasant gentleman who presented with over a year of significant pain in the back; buttock; and bilateral leg, left side worse than right.  He had progressive neurological deficits with a footdrop.  Prior to seeing me, he had undergone injection therapy and he continued to suffer.  Imaging studies confirmed a transitional anatomy with degenerative severe spinal stenosis at L4-L5 with a grade 1 spondylolisthesis at L5-S1 and a severe spinal stenosis at L4-L5 with bilateral lateral recess stenosis.  Because of the progressive neurological deficits and the severe pain, we elected to proceed with surgery.  All appropriate risks, benefits, and alternatives were discussed with the patient.  Consent was obtained.  OPERATIVE NOTE:  The patient was brought to the operating room, placed supine on the operating table.  After successful  induction of general anesthesia and endotracheal intubation, TEDs, SCDs, and a Foley were inserted.  Intraoperative needles were placed for intraoperative NeuroStim monitoring.  The patient was then turned prone onto the Wilson frame and all bony prominences were well padded.  The back was prepped and draped in standard fashion.  Time-out was taken to confirm patient procedure and all other pertinent important data.  Once this was done, a midline incision was made starting at the superior aspect of the L4 pedicle and proceeding down to the inferior aspect of the S1 pedicle. Sharp dissection was carried out down to the deep fascia.  The deep fascia was sharply incised.  Then using a Cobb elevator, I stripped the paraspinal muscles to expose the L4, L5, and S1 spinous processes.  I then stripped out laterally over the lamina to the facet joint.  The L4- L5 and L5-S1 facet capsules were completely removed using a Bovie and I exposed over to the lateral side of the facet complex.  The L3-L4 facet complex was left intact, and I dissected laterally to the facet complex to expose the L4 transverse process.  Once this was done bilaterally, I then confirmed my appropriate levels.  Because of the instability pattern at L5-S1 and the significant stenosis of nerve compression, I elected to proceed with the fusion at L5-S1 and at L4-L5.  However, since the  L4-L5 disk was well-maintained and there was no instability, I did not think interbody fixation would be necessary.  However, I knew that an extensive decompression would be required and this could lead to iatrogenic instability.  Because of its all disk space, I elected not to do any interbody fixation as the __________ has a higher rate of pseudoarthrosis.  As such, I used a double-action Leksell rongeur and removed the entire spinous processes of L5 and L4.  I then developed a plane underneath the lamina and used a 3-mm Kerrison to complete the  central decompression of L5.  I then performed the same decompression at L4.  Once I had the central decompression complete, I developed a plane between the ligamentum flavum and the thecal sac.  There was significant buckling of the ligamentum flavum, facet synovial cysts were noted all consistent with significant stenosis.  I then carried my dissection into the right lateral gutter.  I removed the entire inferior L4 facet and inferior L5 facet.  I then developed a plane underneath the remaining portion of the facet complex and the lateral recess and decompressed this.  I exposed the S1 nerve root and the S1 pedicle.  I then decompressed into the foramen of S1.  I then carried my dissection superiorly until I could identify the L5 nerve root.  I then completed the pars resection __________ performing a complete foraminotomy and lateral recess decompression.  I then proceeded superiorly again resecting the pars to trace the L4 nerve root out into the foramen.  At this point, I could then freely pass a [facility] elevator superiorly above the L4 pedicle. With the right central and lateral recess and foramen decompressed, I then went to the contralateral side and performed the same decompression on the left side.  Again using a Penfield 4 to develop a plane, I used a 2 and 3 mm Kerrison to resect the ligamentum flavum.  There was extensive thickening and compression.  This was even greater than on the right.  There was significant foraminal compromise, especially at the L5- S1 slip level.  I resected the L5 pars, completely removed the inferior L5 facet complex, and completely decompressed this area.  I then did a similar decompression at the L4-L5 level on the left side.  Once the decompression was complete, I then proceeded with the interbody fixation.  An annulotomy on the left side at L5-S1 was performed.  I then used a series of curettes, pituitary rongeurs, and Kerrison rongeurs to  resect all of the disk material at L5-S1.  Once I could freely scrap across the endplates and I had bleeding subchondral bone, I measured with paddle distractors and placed a size 11 extra large Titan titanium intervertebral cage.  This was packed with the local bone that I had harvested from the decompression and DBX mixed.  The cage was malleted to the appropriate depth.  I had excellent fixation.  Once the cage was properly seated, I proceeded with the pedicle screw fixation.  I placed the Jamshidi needle on the junction of the transverse process and the facet.  I then advanced the Jamshidi needle through the pedicle and into the vertebral body at L4.  I stimulated the Jamshidi during this procedure.  I used x-ray to direct visualization and for NeuroStim.  I then placed the Jamshidi at the L5 pedicle and advanced it in the S1.  I then tapped over the guidepin and then removed the guidepins.  I then used my ball-tip  filler to fill each hole and confirmed there was a solid canal.  I then placed at L4 and L5, 45 mm length 6.5 diameter screws and at S1 on the right, I placed a 7.5 diameter 40 mm screw.  On the left side, I placed the same sized screws 6.5 x 45 at L4 and L5 and 6.5 x 40 at S1.  Once all 6 screws were placed, I then again went back and palpated the medial, inferior, and superior aspects of all 3 pedicles and confirmed that there was no breach.  Once this was done, I then placed a rod.  I then removed the kyphosis out of the Wilson frame and the L5-S1 slip had reduced itself.  I then obtained 2 rods, locked them and then properly contoured them and placed them into the pedicle screw construct.  They were then torqued down and locked according to the remainder of the fracture extended.  On the right side, since I did not do as much of the takedown, I was able to place bone graft in the posterolateral gutter. I used the remaining local bone plus contour allograft sheet.  I  then placed a cross-link between the L4 and L5 pedicle screws, to add to the torsional rigidity of the construct.  I then irrigated the wound copiously with normal saline.  I again checked one last time with the [facility] elevator again directly visualizing the L4, L5, and S1 nerve roots on both sides confirming superior and inferior adequate decompression.  The cord itself had re-expanded and there was no ongoing significant stenosis.  I was very pleased with the decompression and stabilization.  I then placed thrombin-soaked Gelfoam paddings over the exposed thecal sac.  I then placed antibiotic impregnated beads into the wound to help decrease the risk of infection.  I closed the deep fascia with interrupted #1 Vicryl sutures and superficial with 2-0 Vicryl sutures.  The skin was closed with 3-0 Monocryl.  Steri-Strips and dry dressing were applied.  The patient was extubated and  transferred to PACU without incident.  First assistant, Zonia KiefJames Owens, was instrumental in assisting with retraction, visualization and suction wound closure.     Alvy Bealahari D Lylla Eifler, MD     DDB/MEDQ  D:  09/14/2013  T:  09/15/2013  Job:  [id number]

## 2013-09-15 NOTE — Progress Notes (Signed)
Occupational Therapy Evaluation Patient Details Name: Jordan West MRN: [id number] DOB: 12/23/1945 Today's Date: 09/15/2013    History of Present Illness Pt is 68 yo Male s/p L4-S1 PLIF   Clinical Impression   PTA pt lived at home with his wife and was independent with ADLs and functional mobility. Pt reports that his wife will assist with LB ADLs and she will be home 11/924/7. Education and training completed with pt and wife for 3/3 back precautions and incorporating into ADLs. Education and training provided for safe DME use (RW). Pt would benefit from continued skilled OT to address bed mobility, knowledge of precautions, and safe tub/toilet transfers to promote independence when pt d/c home.     Follow Up Recommendations  No OT follow up;Supervision/Assistance - 24 hour    Equipment Recommendations  None recommended by OT       Precautions / Restrictions Precautions Precautions: Back;Fall Precaution Booklet Issued: Yes (comment) Precaution Comments: Educated pt and family on 3/3 back precautions and incoporating into ADLs Required Braces or Orthoses: Spinal Brace Spinal Brace: Lumbar corset;Applied in sitting position Restrictions Weight Bearing Restrictions: No      Mobility Bed Mobility Overal bed mobility: Needs Assistance Bed Mobility: Rolling;Sidelying to Sit Rolling: Min guard Sidelying to sit: Min assist       General bed mobility comments: not assessed; reviewed log roll technique with pt  Transfers Overall transfer level: Needs assistance Equipment used: Rolling walker (2 wheeled) Transfers: Sit to/from Stand Sit to Stand: Min assist Stand pivot transfers: Min assist       General transfer comment: Pt with elevated HR (137) after standing and walking towards bathroom. HR decreased to 110bpm after pt returned to recliner. No c/o nausea or dizziness.         ADL Overall ADL's : Needs assistance/impaired Eating/Feeding: Independent;Sitting   Grooming:  Set up;Sitting   Upper Body Bathing: Set up;Sitting   Lower Body Bathing: Moderate assistance;Sit to/from stand;Adhering to back precautions   Upper Body Dressing : Set up;Sitting   Lower Body Dressing: Sit to/from stand;Maximal assistance   Toilet Transfer: Minimal assistance;Ambulation;RW (sit<>stand from recliner)           Functional mobility during ADLs: Minimal assistance;Rolling walker General ADL Comments: Pt reports wife will assist with LB ADLs.     Vision   Per pt report, no change from baseline.                           Pertinent Vitals/Pain Pt reports pain in back (surgical site) but does not provide numerical pain score. Pt grimaces upon sit>stand transfer and reports a "high pain threshold." Elevated HR (137bpm) upon sit>stand and functional mobility to bathroom. HR returned to 110 after pt returned to recliner. No c/o nausea or dizziness.     Hand Dominance Right   Extremity/Trunk Assessment Upper Extremity Assessment Upper Extremity Assessment: Overall WFL for tasks assessed   Lower Extremity Assessment Lower Extremity Assessment: Defer to PT evaluation   Cervical / Trunk Assessment Cervical / Trunk Assessment: Normal   Communication Communication Communication: No difficulties   Cognition Arousal/Alertness: Awake/alert Behavior During Therapy: WFL for tasks assessed/performed Overall Cognitive Status: Within Functional Limits for tasks assessed                                Home Living Family/patient expects to be discharged to:: Private residence Living Arrangements:  Spouse/significant other Available Help at Discharge: Family;Available 24 hours/day Type of Home: House Home Access: Stairs to enter Entergy CorporationEntrance Stairs-Number of Steps: 1 Entrance Stairs-Rails: None Home Layout: One level     Bathroom Shower/Tub: Tub/shower unit Shower/tub characteristics: Engineer, building servicesCurtain Bathroom Toilet: Standard     Home Equipment: None           Prior Functioning/Environment Level of Independence: Independent             OT Diagnosis: Generalized weakness;Acute pain   OT Problem List: Decreased strength;Decreased range of motion;Decreased activity tolerance;Impaired balance (sitting and/or standing);Decreased safety awareness;Decreased knowledge of use of DME or AE;Decreased knowledge of precautions;Pain   OT Treatment/Interventions: Self-care/ADL training;Therapeutic exercise;Energy conservation;DME and/or AE instruction;Therapeutic activities;Patient/family education;Balance training    OT Goals(Current goals can be found in the care plan section) Acute Rehab OT Goals Patient Stated Goal: To go home. OT Goal Formulation: With patient Time For Goal Achievement: 09/22/13 Potential to Achieve Goals: Good ADL Goals Pt Will Perform Grooming: with min guard assist;standing (with RW) Pt Will Transfer to Toilet: with min guard assist;ambulating;regular height toilet (with RW) Pt Will Perform Tub/Shower Transfer: Tub transfer;with min guard assist;ambulating;rolling walker Additional ADL Goal #1: Pt will perform bed mobility using log roll technique with supervision to prepare for ADLs Additional ADL Goal #2: Pt will recall 3/3 back precautions independently for safety during ADLs.  OT Frequency: Min 2X/week    End of Session Equipment Utilized During Treatment: Gait belt;Rolling walker;Back brace Activity Tolerance: Patient tolerated treatment well Patient left: in chair;with call bell/phone within reach;with family/visitor present   Time: [id number] OT Time Calculation (min): 38 min Charges:  OT General Charges $OT Visit: 1 Procedure OT Evaluation $Initial OT Evaluation Tier I: 1 Procedure OT Treatments $Self Care/Home Management : 23-37 mins  Rae LipsLeeann M Rosilyn Coachman [phone] 09/15/2013, 11:50 AM

## 2013-09-15 NOTE — Progress Notes (Signed)
CTSP:  for inability to void NVI No significant leg pain Rectal tone intact Peri-anal sensation intact  No evidence of causa equina syndrome Ok to place foley - will d/c in AM Continue mobilization

## 2013-09-15 NOTE — Progress Notes (Signed)
Subjective: Doing well.  C/o some back pain.  No leg pain.    Objective: Vital signs in last 24 hours: Temp:  [98.5 F (36.9 C)-99 F (37.2 C)] 98.6 F (37 C) (04/16 0626) Pulse Rate:  [91-110] 110 (04/16 0808) Resp:  [13-19] 16 (04/16 0818) BP: (137-157)/(76-87) 142/85 mmHg (04/16 0626) SpO2:  [95 %-99 %] 95 % (04/16 0818)  Intake/Output from previous day: 04/15 0701 - 04/16 0700 In: 4100 [I.V.:3600; IV Piggyback:500] Out: 3350 [Urine:2600; Blood:750] Intake/Output this shift:    No results found for this basename: HGB,  in the last 72 hours No results found for this basename: WBC, RBC, HCT, PLT,  in the last 72 hours No results found for this basename: NA, K, CL, CO2, BUN, CREATININE, GLUCOSE, CALCIUM,  in the last 72 hours No results found for this basename: LABPT, INR,  in the last 72 hours  Neurovascular intact Dorsiflexion/Plantar flexion intact Compartment soft  Assessment/Plan: PT today.  Anticipate d/c home Friday or Saturday      Zonia KiefJames Owens 09/15/2013, 8:38 AM

## 2013-09-15 NOTE — Progress Notes (Signed)
Utilization review completed.  

## 2013-09-15 NOTE — Progress Notes (Signed)
Patient unable to void since cath removed this am at 0600.  Dr. Shon BatonBrooks up to assess patient.  New order to re-place Foley, d/c at 0600 am 09/16/13.

## 2013-09-15 NOTE — Care Management Note (Signed)
CARE MANAGEMENT NOTE 09/15/2013  Patient:  Abagail KitchensRICH,Ladamien A   Account Number:  [id number]  Date Initiated:  09/15/2013  Documentation initiated by:  Vance PeperBRADY,Megin Consalvo  Subjective/Objective Assessment:   68 yr old male s/p L4-S1 decompression.     Action/Plan:   Case manager spoke with patient concerning home health and DME needs.Choice offered. Patient states he has a rolling walker and a whelchair. no DME needs. [facility].   Anticipated DC Date:  09/16/2013   Anticipated DC Plan:  HOME W HOME HEALTH SERVICES      DC Planning Services  CM consult      [facility]  [facility]   Choice offered to / List presented to:  C-1 Patient        HH arranged  HH-2 PT      [facility]  [facility]   Status of service:  Completed, signed off Medicare Important Message given?   (If response is "NO", the following Medicare IM given date fields will be blank) Date Medicare IM given:   Date Additional Medicare IM given:    Discharge Disposition:  HOME W HOME HEALTH SERVICES  Per UR Regulation:

## 2013-09-16 ENCOUNTER — Inpatient Hospital Stay (HOSPITAL_COMMUNITY): Payer: Medicare Other

## 2013-09-16 ENCOUNTER — Encounter (HOSPITAL_COMMUNITY): Payer: Self-pay | Admitting: Orthopedic Surgery

## 2013-09-16 LAB — URINALYSIS, ROUTINE W REFLEX MICROSCOPIC
BILIRUBIN URINE: NEGATIVE
Glucose, UA: NEGATIVE mg/dL
KETONES UR: 15 mg/dL — AB
NITRITE: NEGATIVE
Protein, ur: NEGATIVE mg/dL
SPECIFIC GRAVITY, URINE: 1.014 (ref 1.005–1.030)
UROBILINOGEN UA: 0.2 mg/dL (ref 0.0–1.0)
pH: 5.5 (ref 5.0–8.0)

## 2013-09-16 LAB — URINE CULTURE
COLONY COUNT: NO GROWTH
Culture: NO GROWTH

## 2013-09-16 LAB — GLUCOSE, CAPILLARY
Glucose-Capillary: 124 mg/dL — ABNORMAL HIGH (ref 70–99)
Glucose-Capillary: 149 mg/dL — ABNORMAL HIGH (ref 70–99)
Glucose-Capillary: 143 mg/dL — ABNORMAL HIGH (ref 70–99)
Glucose-Capillary: 153 mg/dL — ABNORMAL HIGH (ref 70–99)
Glucose-Capillary: 118 mg/dL — ABNORMAL HIGH (ref 70–99)

## 2013-09-16 LAB — URINE MICROSCOPIC-ADD ON

## 2013-09-16 LAB — POCT I-STAT 4, (NA,K, GLUC, HGB,HCT)
Glucose, Bld: 204 mg/dL — ABNORMAL HIGH (ref 70–99)
HEMATOCRIT: 28 % — AB (ref 39.0–52.0)
HEMOGLOBIN: 9.5 g/dL — AB (ref 13.0–17.0)
POTASSIUM: 4.6 meq/L (ref 3.7–5.3)
Sodium: 136 mEq/L — ABNORMAL LOW (ref 137–147)

## 2013-09-16 MED ORDER — DOCUSATE SODIUM 100 MG PO CAPS: 100.0000 mg | ORAL_CAPSULE | Freq: Two times a day (BID) | ORAL | Status: DC

## 2013-09-16 MED ORDER — OXYCODONE-ACETAMINOPHEN 10-325 MG PO TABS: 1.0000 | ORAL_TABLET | ORAL | Status: DC | PRN

## 2013-09-16 MED ORDER — OXYCODONE-ACETAMINOPHEN 10-325 MG PO TABS: 1.0000 | ORAL_TABLET | Freq: Four times a day (QID) | ORAL | Status: DC | PRN

## 2013-09-16 MED ORDER — POLYETHYLENE GLYCOL 3350 17 G PO PACK: 17.0000 g | PACK | Freq: Every day | ORAL | Status: DC

## 2013-09-16 MED ORDER — CIPROFLOXACIN HCL 500 MG PO TABS
500.0000 mg | ORAL_TABLET | Freq: Two times a day (BID) | ORAL | Status: DC
  Administered 2013-09-16 – 2013-09-17 (×2): 500 mg via ORAL
  Filled 2013-09-16 (×4): qty 1

## 2013-09-16 MED ORDER — METHOCARBAMOL 500 MG PO TABS: 500.0000 mg | ORAL_TABLET | Freq: Four times a day (QID) | ORAL | Status: DC | PRN

## 2013-09-16 MED ORDER — ONDANSETRON 4 MG PO TBDP: 4.0000 mg | ORAL_TABLET | Freq: Three times a day (TID) | ORAL | Status: DC | PRN

## 2013-09-16 MED FILL — Heparin Sodium (Porcine) Inj 1000 Unit/ML: INTRAMUSCULAR | Qty: 30 | Status: AC

## 2013-09-16 MED FILL — Sodium Chloride IV Soln 0.9%: INTRAVENOUS | Qty: 1000 | Status: AC

## 2013-09-16 NOTE — Progress Notes (Signed)
OT Cancellation Note  Patient Details Name: Jordan West MRN: [id number] DOB: 03/21/1946   Cancelled Treatment:    Reason Eval/Treat Not Completed: Patient declined, no reason specified. OT made visit this date to address independence with ADLs. Pt reports minimal sleep last night and declines participation in therapy at this time. Pt has no OT concerns and plans to sponge bathe at home and perform ADLs with wife's help. Acute OT to sign off at this time.   Rae LipsLeeann M Tamaiya Bump [phone] 09/16/2013, 1:22 PM

## 2013-09-16 NOTE — Progress Notes (Signed)
    Subjective: Procedure(s) (LRB): Lumbar Four-Sacral One Decompression and Transforaminal Lumbar Four-Five Interbody and Lumbar Four-Sacral One Fusion (N/A) 2 Days Post-Op  Patient reports pain as 3 on 0-10 scale.  Reports decreased leg pain reports incisional back pain   Foley removed - monitor for spontaneous void Negative bowel movement Positive flatus Negative chest pain or shortness of breath  Objective: Vital signs in last 24 hours: Temp:  [98.2 F (36.8 C)-101.8 F (38.8 C)] 99.1 F (37.3 C) (04/17 0541) Pulse Rate:  [85-114] 103 (04/17 0541) Resp:  [16-19] 18 (04/17 0541) BP: (102-135)/(58-65) 114/65 mmHg (04/17 0541) SpO2:  [91 %-98 %] 91 % (04/17 0541) Weight:  [167 lb (75.751 kg)] 167 lb (75.751 kg) (04/16 2050)  Intake/Output from previous day: 04/16 0701 - 04/17 0700 In: 1080 [P.O.:1080] Out: 1825 [Urine:1825]  Labs:  Recent Labs  09/14/13 1318  HCT 28.0*    Recent Labs  09/14/13 1318  NA 136*  K 4.6  GLUCOSE 204*   No results found for this basename: LABPT, INR,  in the last 72 hours  Physical Exam: Neurologically intact ABD soft Dorsiflexion/Plantar flexion intact Incision: dressing C/D/I Compartment soft low grade temp noted  Assessment/Plan: Patient stable  xrays satisfactory.  Improved sagittal alignment  Continue mobilization with physical therapy Continue care  Advance diet Up with therapy D/C IV fluids Plan for discharge tomorrow or today if voiding spontaneous and remains afebrile  Venita Lickahari Brenten Janney, MD [facility] [phone]

## 2013-09-16 NOTE — Progress Notes (Signed)
Temp at 2010-101.8. No chills. PA on call notified. Order received for PO Tylenol. Tylenol given and encouraged IS use. Instructed pt on IS use and encouraged to use 10 times per hour while awake. Will continue to monitor.

## 2013-09-16 NOTE — Progress Notes (Signed)
Patient ID: Jordan West, male   DOB: 09/27/1945, 68 y.o.   MRN: [id number]   Spoke with Herbert SetaHeather RN.  Chest xray this morning showed no active disease.  UA possible uti.  Urine culture pending.  Temp a few minutes ago 101.3.  Will go ahead and start cipro 500mg  po bid.  Will keep him tonight for further observation.

## 2013-09-16 NOTE — Progress Notes (Signed)
Physical Therapy Treatment Patient Details Name: Jordan West MRN: [id number] DOB: 01/08/1946 Today's Date: 09/16/2013    History of Present Illness Pt is 68 yo Male s/p L4-S1 PLIF    PT Comments    Patient making good progress with mobility and gait.  Need to try stairs at next session.  Follow Up Recommendations  Home health PT;Supervision/Assistance - 24 hour     Equipment Recommendations  Rolling walker with 5" wheels    Recommendations for Other Services       Precautions / Restrictions Precautions Precautions: Back;Fall Precaution Comments: Patient able to recall 3/3 back precautions Required Braces or Orthoses: Spinal Brace Spinal Brace: Lumbar corset;Applied in sitting position Restrictions Weight Bearing Restrictions: No    Mobility  Bed Mobility Overal bed mobility: Needs Assistance Bed Mobility: Rolling;Sidelying to Sit Rolling: Supervision Sidelying to sit: Supervision       General bed mobility comments: Patient uses correct technique.  Able to don back brace independently.  Transfers Overall transfer level: Needs assistance Equipment used: Rolling walker (2 wheeled) Transfers: Sit to/from Stand Sit to Stand: Min assist         General transfer comment: Patient uses correct hand placement.  Assist to rise to standing from bed.  Encouraged patient to sit on high surfaces at home (build up surfaces using folded blankets).  Ambulation/Gait Ambulation/Gait assistance: Supervision Ambulation Distance (Feet): 350 Feet Assistive device: Rolling walker (2 wheeled) Gait Pattern/deviations: Step-through pattern;Decreased step length - right;Decreased step length - left;Decreased stride length Gait velocity: Decreased Gait velocity interpretation: Below normal speed for age/gender General Gait Details: Proper use of RW demonstrated.  Cues to stand upright during gait.   Stairs            Wheelchair Mobility    Modified Rankin (Stroke  Patients Only)       Balance                                    Cognition Arousal/Alertness: Awake/alert Behavior During Therapy: WFL for tasks assessed/performed Overall Cognitive Status: Within Functional Limits for tasks assessed                      Exercises      General Comments        Pertinent Vitals/Pain     Home Living                      Prior Function            PT Goals (current goals can now be found in the care plan section) Progress towards PT goals: Progressing toward goals    Frequency  Min 5X/week    PT Plan Current plan remains appropriate    Co-evaluation             End of Session Equipment Utilized During Treatment: Gait belt;Back brace Activity Tolerance: Patient tolerated treatment well Patient left: in chair;with call bell/phone within reach     Time: [id number] PT Time Calculation (min): 24 min  Charges:  $Gait Training: 23-37 mins                    G Codes:      Vena AustriaSusan H Stony Stegmann 09/16/2013, 7:15 PM Durenda HurtSusan H. Renaldo Fiddleravis, PT, [facility] Acute Rehab Services Pager [phone]

## 2013-09-17 LAB — URINE CULTURE: Colony Count: 2000

## 2013-09-17 LAB — GLUCOSE, CAPILLARY: Glucose-Capillary: 159 mg/dL — ABNORMAL HIGH (ref 70–99)

## 2013-09-17 MED ORDER — CIPROFLOXACIN HCL 500 MG PO TABS: 500.0000 mg | ORAL_TABLET | Freq: Every day | ORAL | Status: AC

## 2013-09-17 MED ORDER — CIPROFLOXACIN HCL 500 MG PO TABS: 500.0000 mg | ORAL_TABLET | Freq: Two times a day (BID) | ORAL | Status: DC

## 2013-09-17 NOTE — Evaluation (Signed)
Occupational Therapy Re-Evaluation Patient Details Name: Jordan West MRN: [id number] DOB: 10/16/1945 Today's Date: 09/17/2013    History of Present Illness Pt is 68 yo Male s/p L4-S1 PLIF   Clinical Impression   Pt with questions regarding AE to PT so PT notified OT that pt having questions regarding the AE. New order obtained for OT as pt already seen by OT for eval but then pt stating no further needs on next visit and OT signed off. Re eval performed today. No DME needs. Educated on back precautions, AE and self care completion with adherence to back precautions. No further questions/concerns. Pt supposed to d/c today.    Follow Up Recommendations  Supervision/Assistance - 24 hour    Equipment Recommendations  None recommended by OT    Recommendations for Other Services       Precautions / Restrictions Precautions Precautions: Back;Fall Precaution Comments: reviewed all precautions with pt and wife; pt could only state 1/3 on his own. Required Braces or Orthoses: Spinal Brace Spinal Brace: Lumbar corset;Applied in sitting position Restrictions Weight Bearing Restrictions: No      Mobility Bed Mobility                  Transfers Overall transfer level: Needs assistance Equipment used: Rolling walker (2 wheeled) Transfers: Sit to/from Stand Sit to Stand: Supervision         General transfer comment: Verbal cues for hand placement.    Balance                                            ADL Overall ADL's : Independent Eating/Feeding: Sitting   Grooming: Wash/dry hands;Set up;Sitting   Upper Body Bathing: Set up;Supervision/ safety;Sitting   Lower Body Bathing: Adhering to back precautions;Moderate assistance;Sit to/from stand (without AE)   Upper Body Dressing : Set up;Supervision/safety;Sitting (and brace)   Lower Body Dressing: Minimal assistance;Sit to/from stand;Adhering to back precautions;With adaptive equipment (with AE for  underwear and shorts)   Toilet Transfer: Min guard;RW             General ADL Comments: Wife present for reeval . Instructed pt and wife on AE options and pt has all but the LHS. Educated on where they can obtain AE. Pt states wife can also assist but he did practice with the reacher and sock aid to doff/don socks and shorts/underwear. Emphasized need to have brace donned in sitting and on by the time he stands up. Needed min reinforcement for hand placement with transitions.  Pt plans to sponge bathe initially and discussed use of a seat to sit on at sink. Pt unable to reach posterior periarea without breaking precautions so introduced toilet aid option and coverage. Pt states wife will assist with toilet hygiene and wife agrees. Again reviewed back precautions at end of session.      Vision                     Perception     Praxis      Pertinent Vitals/Pain 4-5/10 back; reposition.     Hand Dominance Right   Extremity/Trunk Assessment Upper Extremity Assessment Upper Extremity Assessment: Overall WFL for tasks assessed           Communication Communication Communication: No difficulties   Cognition Arousal/Alertness: Awake/alert Behavior During Therapy: WFL for tasks assessed/performed Overall Cognitive Status: Within  Functional Limits for tasks assessed                     General Comments       Exercises       Shoulder Instructions      Home Living Family/patient expects to be discharged to:: Private residence Living Arrangements: Spouse/significant other Available Help at Discharge: Family;Available 24 hours/day Type of Home: House Home Access: Stairs to enter Entergy CorporationEntrance Stairs-Number of Steps: 1 Entrance Stairs-Rails: None Home Layout: One level     Bathroom Shower/Tub: Tub/shower unit Shower/tub characteristics: Engineer, building servicesCurtain Bathroom Toilet: Standard     Home Equipment: Toilet riser;Adaptive equipment Adaptive Equipment: Reacher;Sock  aid;Long-handled shoe horn        Prior Functioning/Environment Level of Independence: Independent             OT Diagnosis:     OT Problem List:     OT Treatment/Interventions:      OT Goals(Current goals can be found in the care plan section) Acute Rehab OT Goals Patient Stated Goal: To go home.  OT Frequency:     Barriers to D/C:            Co-evaluation              End of Session Equipment Utilized During Treatment: Rolling walker;Back brace  Activity Tolerance: Patient tolerated treatment well Patient left: in chair;with call bell/phone within reach;with family/visitor present   Time: [id number] OT Time Calculation (min): 23 min Charges:  OT General Charges $OT Visit: 1 Procedure OT Evaluation $OT Re-eval: 1 Procedure OT Treatments $Self Care/Home Management : 8-22 mins G-Codes:    Sabino GasserStephanie Stafford Dariel Betzer [phone] 09/17/2013, 10:56 AM

## 2013-09-17 NOTE — Progress Notes (Signed)
Subjective: 3 Days Post-Op Procedure(s) (LRB): Lumbar Four-Sacral One Decompression and Transforaminal Lumbar Four-Five Interbody and Lumbar Four-Sacral One Fusion (N/A) Patient reports pain as mild.  Ambulating well.  Wants to go home.  Objective: Vital signs in last 24 hours: Temp:  [99.3 F (37.4 C)-101.3 F (38.5 C)] 99.3 F (37.4 C) (04/18 0555) Pulse Rate:  [104-114] 104 (04/18 0555) Resp:  [18-20] 18 (04/18 0555) BP: (120-138)/(58-75) 138/68 mmHg (04/18 0555) SpO2:  [90 %-97 %] 93 % (04/18 0555)  Intake/Output from previous day: 04/17 0701 - 04/18 0700 In: 1440 [P.O.:1440] Out: 1075 [Urine:1075] Intake/Output this shift: Total I/O In: -  Out: 200 [Urine:200]   Recent Labs  09/14/13 1318  HGB 9.5*    Recent Labs  09/14/13 1318  HCT 28.0*    Recent Labs  09/14/13 1318  NA 136*  K 4.6  GLUCOSE 204*   No results found for this basename: LABPT, INR,  in the last 72 hours  PE:  wn wd male in nad.  Wound dressed and dry.  NVI at B LEs.  Assessment/Plan: 3 Days Post-Op Procedure(s) (LRB): Lumbar Four-Sacral One Decompression and Transforaminal Lumbar Four-Five Interbody and Lumbar Four-Sacral One Fusion (N/A) D/c home.  Jordan West 09/17/2013, 8:58 AM

## 2013-09-17 NOTE — Progress Notes (Signed)
Physical Therapy Treatment Patient Details Name: Jordan West MRN: [id number] DOB: 02/07/1946 Today's Date: 09/17/2013    History of Present Illness Pt is 68 yo Male s/p L4-S1 PLIF    PT Comments    Pt at mobility level safe to discharge home. Pt/spouse deferred stair education as pt has a ramp and home and no need to go up/down stairs.  Follow Up Recommendations  Home health PT;Supervision/Assistance - 24 hour     Equipment Recommendations  Rolling walker with 5" wheels    Precautions / Restrictions Precautions Precautions: Back;Fall Precaution Comments: pt unable to recall all precautions (only not to twist), reeducated pt and spouse on all 3. Required Braces or Orthoses: Spinal Brace Spinal Brace: Lumbar corset (pt independent with brace) Restrictions Weight Bearing Restrictions: No    Mobility   Transfers Overall transfer level: Modified independent Equipment used: Rolling walker (2 wheeled) Transfers: Sit to/from Stand Sit to Stand: Modified independent (Device/Increase time)         General transfer comment: no cues or assist needed.  Ambulation/Gait Ambulation/Gait assistance: Modified independent (Device/Increase time) Ambulation Distance (Feet): 800 Feet Assistive device: Rolling walker (2 wheeled) Gait Pattern/deviations: Step-through pattern;WFL(Within Functional Limits) Gait velocity: Decreased   General Gait Details: Proper use of RW demonstrated.  Cues to stand upright during gait.       Cognition Arousal/Alertness: Awake/alert Behavior During Therapy: WFL for tasks assessed/performed Overall Cognitive Status: Within Functional Limits for tasks assessed                         Home Living Family/patient expects to be discharged to:: Private residence Living Arrangements: Spouse/significant other Available Help at Discharge: Family;Available 24 hours/day Type of Home: House Home Access: Stairs to enter Entrance Stairs-Rails:  None Home Layout: One level Home Equipment: Toilet riser;Adaptive equipment      Prior Function Level of Independence: Independent          PT Goals (current goals can now be found in the care plan section) Acute Rehab PT Goals Patient Stated Goal: To go home. PT Goal Formulation: With patient Time For Goal Achievement: 09/22/13 Potential to Achieve Goals: Good Progress towards PT goals: Progressing toward goals    Frequency  Min 5X/week    PT Plan Current plan remains appropriate    End of Session Equipment Utilized During Treatment: Gait belt;Back brace Activity Tolerance: Patient tolerated treatment well Patient left: in bed;with call bell/phone within reach;with family/visitor present     Time: 1191-47820949-1010 PT Time Calculation (min): 21 min  Charges:  $Gait Training: 8-22 mins                    G Codes:      Sallyanne KusterKathy Bury 09/17/2013, 12:27 PM  Sallyanne KusterKathy Bury, PTA Office- [phone]

## 2013-09-27 ENCOUNTER — Ambulatory Visit: Payer: Self-pay | Admitting: Cardiovascular Disease

## 2013-10-04 NOTE — Discharge Summary (Signed)
Agree with above 

## 2013-10-04 NOTE — Discharge Summary (Signed)
Patient ID: Jordan West MRN: [id number] DOB/AGE: [birth year] 68 y.o.  Admit date: 09/14/2013 Discharge date: 10/04/2013  Admission Diagnoses:  Active Problems:   Back pain   Discharge Diagnoses:  Active Problems:   Back pain  status post Procedure(s): Lumbar Four-Sacral One Decompression and Transforaminal Lumbar Four-Five Interbody and Lumbar Four-Sacral One Fusion  Past Medical History  Diagnosis Date  . Hypertension   . Diabetes mellitus without complication   . Asthma   . Pneumonia   . Hx of hypercholesterolemia     PMH  . GERD (gastroesophageal reflux disease)     Surgeries: Procedure(s): Lumbar Four-Sacral One Decompression and Transforaminal Lumbar Four-Five Interbody and Lumbar Four-Sacral One Fusion on 09/14/2013   Consultants: none  Discharged Condition: Improved  Hospital Course: Jordan West is an 68 y.o. male who was admitted 09/14/2013 for operative treatment of L4-S1 stenosis. Patient failed conservative treatments (please see the history and physical for the specifics) and had severe unremitting pain that affects sleep, daily activities and work/hobbies. After pre-op clearance, the patient was taken to the operating room on 09/14/2013 and underwent  Procedure(s): Lumbar Four-Sacral One Decompression and Transforaminal Lumbar Four-Five Interbody and Lumbar Four-Sacral One Fusion.    Patient was given perioperative antibiotics:  Anti-infectives   Start     Dose/Rate Route Frequency Ordered Stop   09/17/13 0000  ciprofloxacin (CIPRO) 500 MG tablet     500 mg Oral 2 times daily 09/17/13 0801     09/17/13 0000  ciprofloxacin (CIPRO) 500 MG tablet     500 mg Oral Daily with breakfast 09/17/13 0854 09/19/13 2359   09/16/13 1430  ciprofloxacin (CIPRO) tablet 500 mg  Status:  Discontinued     500 mg Oral 2 times daily 09/16/13 1403 09/17/13 1435   09/14/13 2100  ceFAZolin (ANCEF) IVPB 1 g/50 mL premix     1 g 100 mL/hr over 30 Minutes Intravenous Every 8  hours 09/14/13 1802 09/15/13 0545   09/14/13 1204  vancomycin (VANCOCIN) powder  Status:  Discontinued       As needed 09/14/13 1204 09/14/13 1524   09/13/13 1436  ceFAZolin (ANCEF) IVPB 2 g/50 mL premix     2 g 100 mL/hr over 30 Minutes Intravenous 30 min pre-op 09/13/13 1436 09/14/13 1255       Patient was given sequential compression devices and early ambulation to prevent DVT.   Patient benefited maximally from hospital stay and there were no complications. At the time of discharge, the patient was urinating/moving their bowels without difficulty, tolerating a regular diet, pain is controlled with oral pain medications and they have been cleared by PT/OT.   Recent vital signs: No data found.    Recent laboratory studies: No results found for this basename: WBC, HGB, HCT, PLT, NA, K, CL, CO2, BUN, CREATININE, GLUCOSE, PT, INR, CALCIUM, 2,  in the last 72 hours   Discharge Medications:     Medication List    STOP taking these medications       HYDROcodone-acetaminophen 5-325 MG per tablet  Commonly known as:  NORCO/VICODIN      TAKE these medications       albuterol 108 (90 BASE) MCG/ACT inhaler  Commonly known as:  PROVENTIL HFA;VENTOLIN HFA  Inhale 1 puff into the lungs every 6 (six) hours as needed for wheezing or shortness of breath.     ciprofloxacin 500 MG tablet  Commonly known as:  CIPRO  Take 1 tablet (500 mg total) by  mouth 2 (two) times daily.     docusate sodium 100 MG capsule  Commonly known as:  COLACE  Take 1 capsule (100 mg total) by mouth 2 (two) times daily.     Fluticasone-Salmeterol 250-50 MCG/DOSE Aepb  Commonly known as:  ADVAIR  Inhale 1 puff into the lungs 2 (two) times daily.     losartan 50 MG tablet  Commonly known as:  COZAAR  Take 50 mg by mouth daily.     metFORMIN 1000 MG tablet  Commonly known as:  GLUCOPHAGE  Take 1,000 mg by mouth 2 (two) times daily with a meal.     methocarbamol 500 MG tablet  Commonly known as:  ROBAXIN    Take 1 tablet (500 mg total) by mouth every 6 (six) hours as needed for muscle spasms.     multivitamin with minerals Tabs tablet  Take 1 tablet by mouth daily.     omeprazole 20 MG capsule  Commonly known as:  PRILOSEC  Take 20 mg by mouth daily before breakfast.     ondansetron 4 MG disintegrating tablet  Commonly known as:  ZOFRAN ODT  Take 1 tablet (4 mg total) by mouth every 8 (eight) hours as needed for nausea or vomiting.     oxyCODONE-acetaminophen 10-325 MG per tablet  Commonly known as:  PERCOCET  Take 1-2 tablets by mouth every 6 (six) hours as needed for pain.     polyethylene glycol packet  Commonly known as:  MIRALAX / GLYCOLAX  Take 17 g by mouth daily.        Diagnostic Studies: Dg Lumbar Spine 2-3 Views  09/15/2013   CLINICAL DATA:  Low back pain.  Postop.  EXAM: LUMBAR SPINE - 2-3 VIEW  COMPARISON:  DG C-ARM GT 120 MIN dated 09/14/2013; DG LUMBAR SPINE 2-3 VIEWS dated 09/14/2013; DG LUMBAR SPINE 2-3 VIEWS dated 09/08/2013  FINDINGS: The patient has undergone L4-5 and L5-S1 fusion. The anterolisthesis at L5-S1 is improved, now measuring 3 mm. Interbody cage has been placed at L5-S1. Trace retrolisthesis L4-5. Pedicle screws appear well seated within the bone.  IMPRESSION: Improved appearance status post L4-S1 fusion.   Electronically Signed   By: Davonna BellingJohn  Curnes M.D.   On: 09/15/2013 08:22   Dg Lumbar Spine 2-3 Views  09/14/2013   CLINICAL DATA:  Lumbar fusion  EXAM: LUMBAR SPINE - 2-3 VIEW; DG C-ARM GT 120 MIN  COMPARISON:  09/08/2013  FINDINGS: Three intraoperative fluoroscopic spot images document placement of bilateral pedicle screws at L4, L5, and S1 with vertical interconnecting hardware. Interbody cage projects at L5-S1. There has been reduction of the anterolisthesis L5-S1 seen previously.  IMPRESSION: 1. PLIF L4-S1   Electronically Signed   By: Oley Balmaniel  Hassell M.D.   On: 09/14/2013 16:44   Dg Lumbar Spine 2-3 Views  09/08/2013   CLINICAL DATA:  Preop back fusion.   EXAM: LUMBAR SPINE - 2-3 VIEW  COMPARISON:  CT ANGIO CHEST-ABD-PELV dated 11/21/2012  FINDINGS: There is transitional anatomy at the lumbosacral junction. There is partial lumbarization of S1. Approximately 8-9 mm of anterolisthesis of L5 on S1. This is stable since prior CT. Mild disc space narrowing at L4-5 through L5-S1. No fracture. SI joints are symmetric and unremarkable.  IMPRESSION: Transitional anatomy at the lumbosacral junction with partially lumbarized S1 segment. Correlate closely with any outside cross-sectional imaging.  Grade 1 anterolisthesis of L5 on S1, stable since prior CT.   Electronically Signed   By: Charlett NoseKevin  Dover M.D.   On:  09/08/2013 15:38   Dg Chest Port 1 View  09/16/2013   CLINICAL DATA:  Fever  EXAM: PORTABLE CHEST - 1 VIEW  COMPARISON:  DG C-ARM GT 120 MIN dated 09/14/2013; DG CHEST 2V dated 12/16/2012  FINDINGS: Low lung volumes. The heart size and mediastinal contours are within normal limits. Both lungs are clear. The visualized skeletal structures are unremarkable.  IMPRESSION: No active disease.   Electronically Signed   By: Salome HolmesHector  Cooper M.D.   On: 09/16/2013 08:42   Dg C-arm Gt 120 Min  09/14/2013   CLINICAL DATA:  Lumbar fusion  EXAM: LUMBAR SPINE - 2-3 VIEW; DG C-ARM GT 120 MIN  COMPARISON:  09/08/2013  FINDINGS: Three intraoperative fluoroscopic spot images document placement of bilateral pedicle screws at L4, L5, and S1 with vertical interconnecting hardware. Interbody cage projects at L5-S1. There has been reduction of the anterolisthesis L5-S1 seen previously.  IMPRESSION: 1. PLIF L4-S1   Electronically Signed   By: Oley Balmaniel  Hassell M.D.   On: 09/14/2013 16:44        Discharge Orders   Future Orders Complete By Expires   Call MD / Call 911  As directed    Constipation Prevention  As directed    Diet - low sodium heart healthy  As directed    Discharge instructions  As directed    Driving restrictions  As directed    Increase activity slowly as tolerated  As  directed    Lifting restrictions  As directed       Follow-up Information   Follow up with [facility]. (Someone from [facility] will contact you concerning start date and time for physical therapy.)    Contact information:   [address] [place] [address] [phone]       Schedule an appointment as soon as possible for a visit with Alvy BealBROOKS,DAHARI D, MD. (need return office visit 2 weeks postop)    Specialty:  Orthopedic Surgery   Contact information:   [address] [place] [address] [phone]       Discharge Plan:  discharge to home  Disposition:     Signed: Zonia KiefJames Emmanuel Ercole for Dr. Venita Lickahari Brooks [facility] [phone] 10/04/2013, 9:29 AM

## 2013-11-10 ENCOUNTER — Telehealth: Payer: Self-pay | Admitting: Cardiovascular Disease

## 2013-11-10 NOTE — Telephone Encounter (Signed)
Office notified via fax that patient cancelled appointment 09/27/13 with Dr. Allyson Sabal.

## 2015-05-08 IMAGING — CR DG LUMBAR SPINE 2-3V
2 series · 2 of 2 positions shown · non-contrast
Comparison: DG C-ARM GT 120 MIN dated 09/14/2013;

CLINICAL DATA: Low back pain.  Postop.

EXAM:
LUMBAR SPINE - 2-3 VIEW

[t l-spine a.p.]
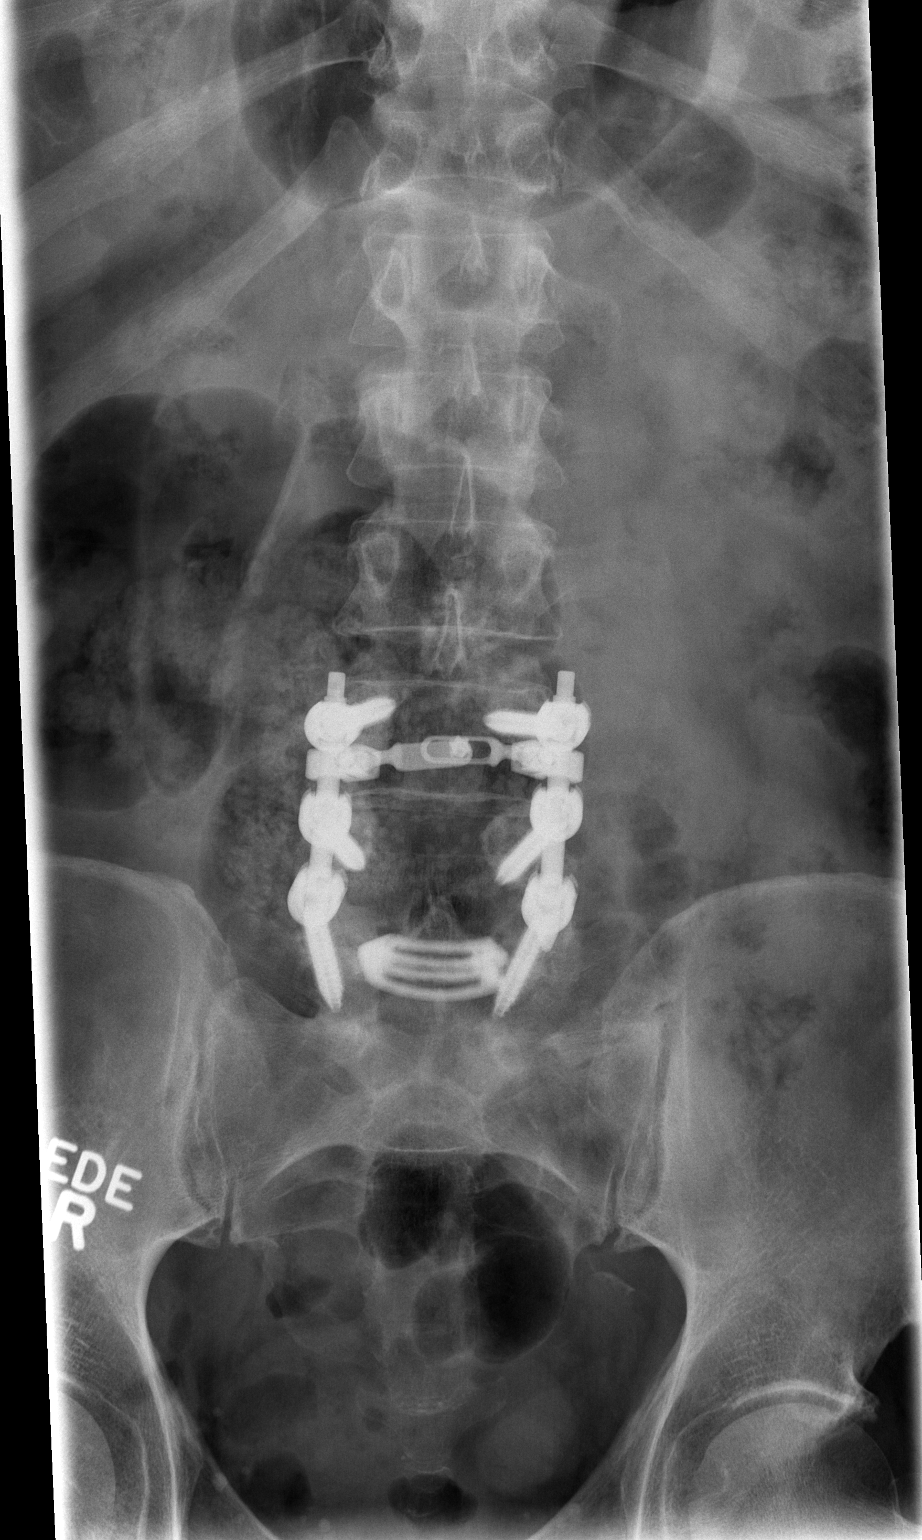

[t l-spine lat]
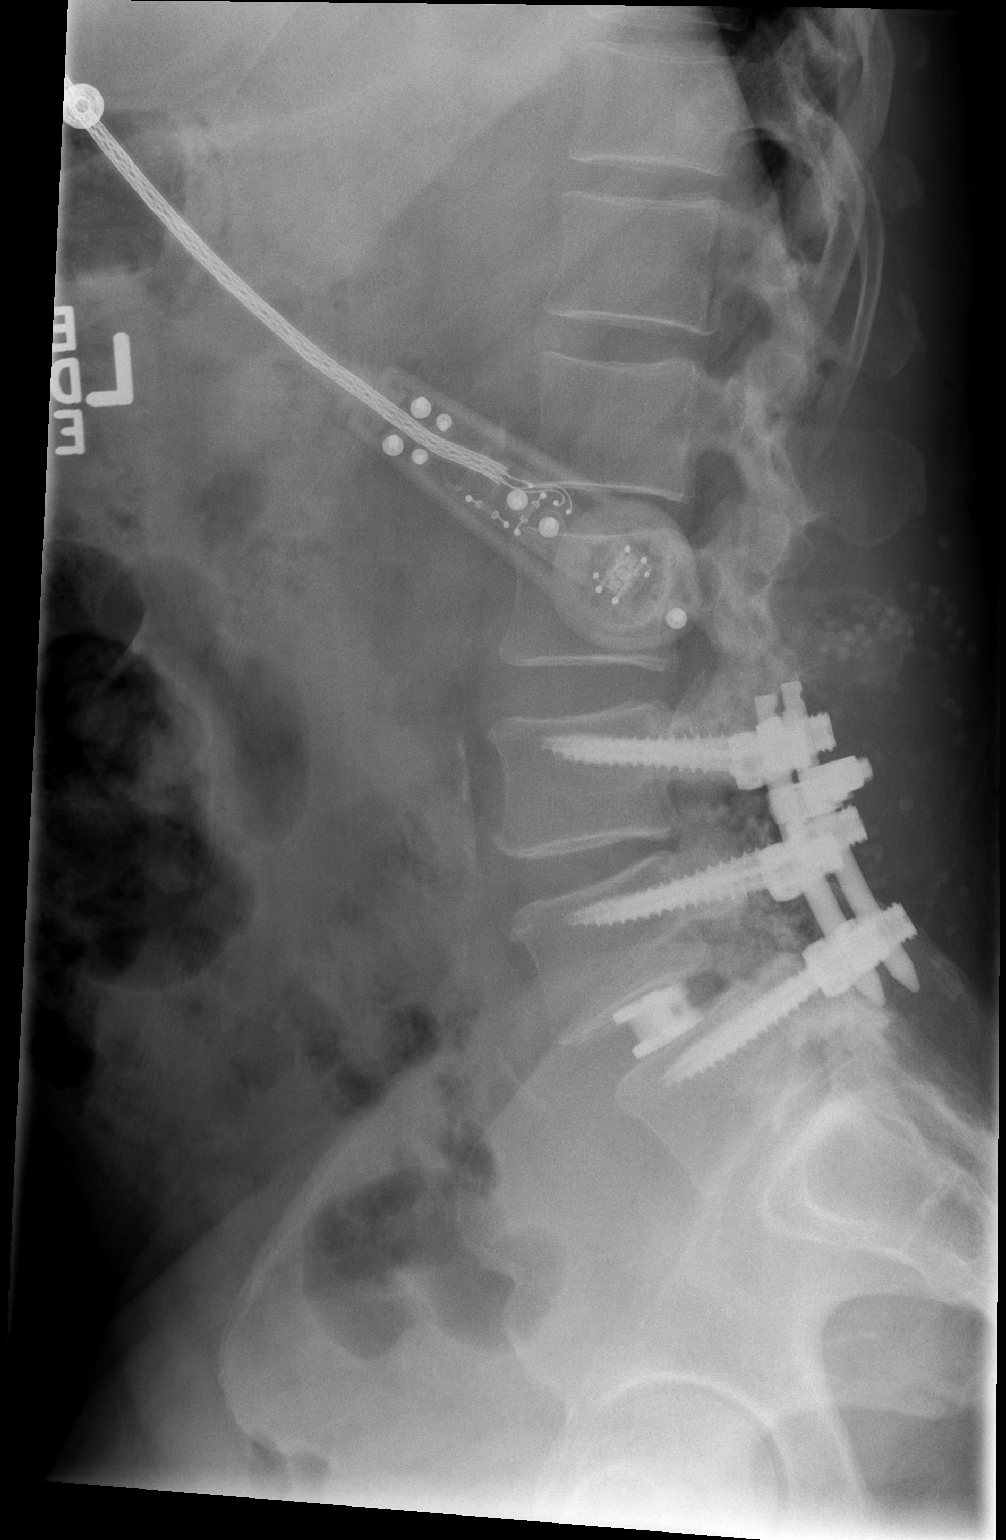

[2 of 2 positions shown; findings below may reference images not displayed]

DG LUMBAR SPINE
2-3 VIEWS dated 09/14/2013; DG LUMBAR SPINE 2-3 VIEWS dated 09/08/2013
FINDINGS: The patient has undergone L4-5 and L5-S1 fusion. The anterolisthesis
at L5-S1 is improved, now measuring 3 mm. Interbody cage has been
placed at L5-S1. Trace retrolisthesis L4-5. Pedicle screws appear
well seated within the bone.
IMPRESSION: Improved appearance status post L4-S1 fusion.

## 2015-05-09 IMAGING — DX DG CHEST 1V PORT
1 series · 1 of 1 positions shown · non-contrast
Comparison: DG C-ARM GT 120 MIN dated 09/14/2013; DG CHEST 2V dated
12/16/2012

CLINICAL DATA: Fever

EXAM:
PORTABLE CHEST - 1 VIEW

[portable]
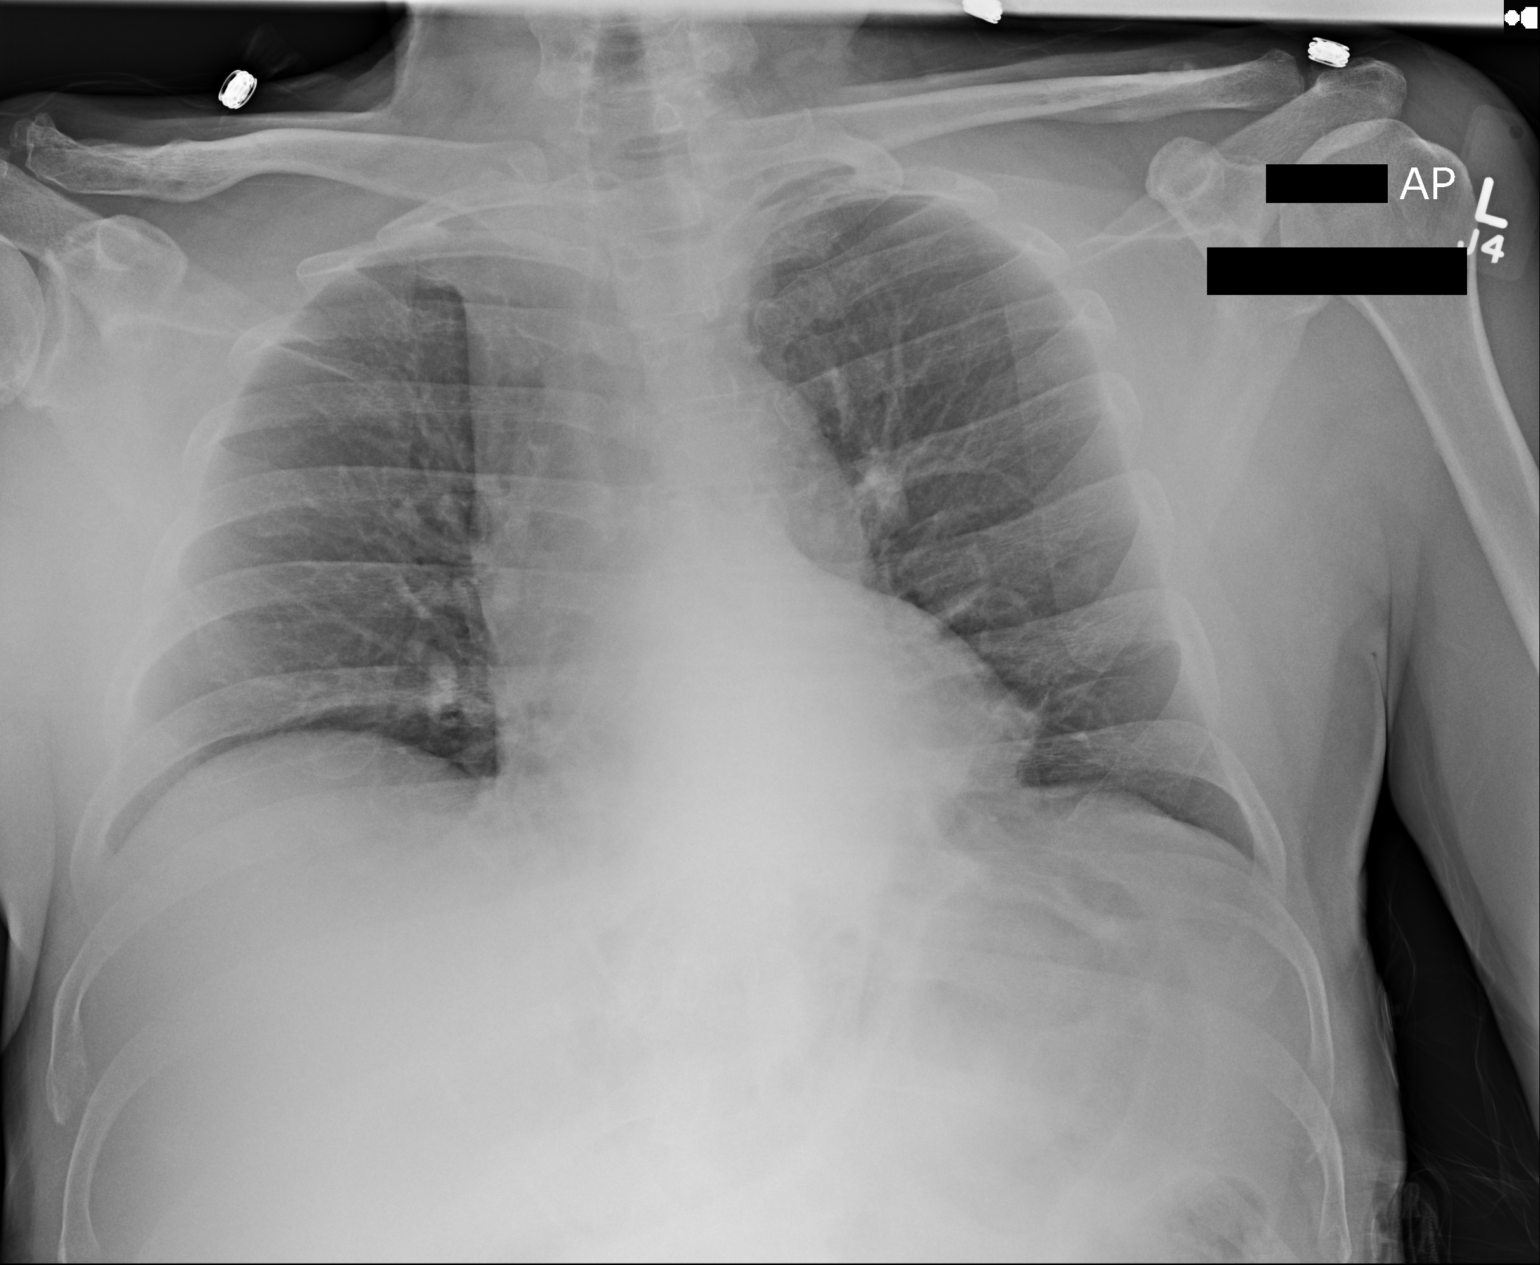

[1 of 1 positions shown; findings below may reference images not displayed]

FINDINGS: Low lung volumes. The heart size and mediastinal contours are within
normal limits. Both lungs are clear. The visualized skeletal
structures are unremarkable.
IMPRESSION: No active disease.

## 2016-05-23 DIAGNOSIS — K760 Fatty (change of) liver, not elsewhere classified: Secondary | ICD-10-CM | POA: Insufficient documentation

## 2017-08-05 DIAGNOSIS — K219 Gastro-esophageal reflux disease without esophagitis: Secondary | ICD-10-CM | POA: Insufficient documentation

## 2017-08-05 DIAGNOSIS — I129 Hypertensive chronic kidney disease with stage 1 through stage 4 chronic kidney disease, or unspecified chronic kidney disease: Secondary | ICD-10-CM | POA: Insufficient documentation

## 2017-12-07 ENCOUNTER — Telehealth: Payer: Self-pay

## 2017-12-07 NOTE — Telephone Encounter (Signed)
SENT REFERRAL TO SCHEDULING AND FILED NOTES 

## 2018-09-15 DIAGNOSIS — M5416 Radiculopathy, lumbar region: Secondary | ICD-10-CM | POA: Insufficient documentation

## 2019-02-09 DIAGNOSIS — L309 Dermatitis, unspecified: Secondary | ICD-10-CM | POA: Insufficient documentation

## 2019-06-13 ENCOUNTER — Other Ambulatory Visit: Payer: Self-pay

## 2019-06-13 ENCOUNTER — Encounter (HOSPITAL_COMMUNITY): Payer: Self-pay | Admitting: Emergency Medicine

## 2019-06-13 ENCOUNTER — Emergency Department (HOSPITAL_COMMUNITY): Payer: Medicare Other

## 2019-06-13 ENCOUNTER — Ambulatory Visit: Payer: Medicare Other

## 2019-06-13 ENCOUNTER — Emergency Department (HOSPITAL_COMMUNITY)
Admission: EM | Admit: 2019-06-13 | Discharge: 2019-06-14 | Disposition: A | Discharge status: Home / Self Care | Payer: Medicare Other | Attending: Emergency Medicine | Admitting: Emergency Medicine

## 2019-06-13 DIAGNOSIS — I10 Essential (primary) hypertension: Secondary | ICD-10-CM | POA: Diagnosis not present

## 2019-06-13 DIAGNOSIS — J45909 Unspecified asthma, uncomplicated: Secondary | ICD-10-CM | POA: Diagnosis not present

## 2019-06-13 DIAGNOSIS — R0602 Shortness of breath: Secondary | ICD-10-CM | POA: Diagnosis not present

## 2019-06-13 DIAGNOSIS — E119 Type 2 diabetes mellitus without complications: Secondary | ICD-10-CM | POA: Diagnosis not present

## 2019-06-13 DIAGNOSIS — R05 Cough: Secondary | ICD-10-CM | POA: Diagnosis present

## 2019-06-13 DIAGNOSIS — Z20822 Contact with and (suspected) exposure to covid-19: Secondary | ICD-10-CM

## 2019-06-13 DIAGNOSIS — R1110 Vomiting, unspecified: Secondary | ICD-10-CM | POA: Diagnosis not present

## 2019-06-13 DIAGNOSIS — U071 COVID-19: Secondary | ICD-10-CM | POA: Insufficient documentation

## 2019-06-13 NOTE — ED Triage Notes (Signed)
Patient arrived with EMS from home , patient exposed to Covid+ individual this week , pt. stated fever /productive cough and SOB .

## 2019-06-14 LAB — POC SARS CORONAVIRUS 2 AG -  ED: SARS Coronavirus 2 Ag: POSITIVE — AB

## 2019-06-14 LAB — NOVEL CORONAVIRUS, NAA: SARS-CoV-2, NAA: NOT DETECTED

## 2019-06-14 MED ORDER — ALBUTEROL SULFATE HFA 108 (90 BASE) MCG/ACT IN AERS
2.0000 | INHALATION_SPRAY | Freq: Once | RESPIRATORY_TRACT | Status: AC
  Administered 2019-06-14: 2 via RESPIRATORY_TRACT
  Filled 2019-06-14: qty 6.7

## 2019-06-14 MED ORDER — BENZONATATE 100 MG PO CAPS
100.0000 mg | ORAL_CAPSULE | Freq: Once | ORAL | Status: AC
  Administered 2019-06-14: 100 mg via ORAL
  Filled 2019-06-14: qty 1

## 2019-06-14 MED ORDER — LACTATED RINGERS IV BOLUS
1000.0000 mL | Freq: Once | INTRAVENOUS | Status: AC
  Administered 2019-06-14: 07:00:00 1000 mL via INTRAVENOUS

## 2019-06-14 MED ORDER — ONDANSETRON HCL 4 MG/2ML IJ SOLN
4.0000 mg | Freq: Once | INTRAMUSCULAR | Status: DC
  Filled 2019-06-14: qty 2

## 2019-06-14 NOTE — ED Provider Notes (Signed)
Emergency Department Provider Note   I have reviewed the triage vital signs and the nursing notes.   HISTORY  Chief Complaint Covid+ Exposure / Fever/Cough   HPI Jordan West is a 74 y.o. male with medical problems documented below who presents emergency department today with a cough, shortness of breath and emesis.  Patient states that he has been sick for couple weeks and went to see his doctor and was told to come to the ER for Covid test, x-ray and fluids.  Patient states he has had friends that tested positive coronavirus.  Patient has been afebrile.  No loss of taste.  No diarrhea or constipation.  No abdominal pain except for when coughing.  No chest pain.  No history of smoking, alcohol or drugs.   No other associated or modifying symptoms.    Past Medical History:  Diagnosis Date  . Asthma   . Diabetes mellitus without complication (HCC)   . GERD (gastroesophageal reflux disease)   . Hx of hypercholesterolemia    PMH  . Hypertension   . Pneumonia     Patient Active Problem List   Diagnosis Date Noted  . Back pain 09/14/2013    Past Surgical History:  Procedure Laterality Date  . COLONOSCOPY    . DECOMPRESSIVE LUMBAR LAMINECTOMY LEVEL 2 N/A 09/14/2013   Procedure: Lumbar Four-Sacral One Decompression and Transforaminal Lumbar Four-Five Interbody and Lumbar Four-Sacral One Fusion;  Surgeon: Venita Lick, MD;  Location: [facility] OR;  Service: Orthopedics;  Laterality: N/A;  . FRACTURE SURGERY     left pinky  . LUNG SURGERY      Current Outpatient Rx  . Order #: [id number] Class: Historical Med  . Order #: [id number] Class: No Print  . Order #: [id number] Class: Print  . Order #: [id number] Class: Historical Med  . Order #: [id number] Class: Historical Med  . Order #: [id number] Class: Historical Med  . Order #: [id number] Class: Print  . Order #: [id number] Class: Historical Med  . Order #: [id number] Class: Historical Med  . Order #: [id number] Class: Print  . Order #:  [id number] Class: Print  . Order #: [id number] Class: Print    Allergies Patient has no known allergies.  Family History  Problem Relation Age of Onset  . Cancer - Colon Mother   . Diabetes Mother   . Diabetes Father   . Heart disease Father   . Diabetes Sister   . Diabetes Brother   . Asthma Other   . Leukemia Other   . Cancer - Prostate Other     Social History Social History   Tobacco Use  . Smoking status: Never Smoker  . Smokeless tobacco: Never Used  Substance Use Topics  . Alcohol use: No  . Drug use: No    Review of Systems  All other systems negative except as documented in the HPI. All pertinent positives and negatives as reviewed in the HPI. ____________________________________________   PHYSICAL EXAM:  VITAL SIGNS: ED Triage Vitals  Enc Vitals Group     BP 06/13/19 2059 130/73     Pulse Rate 06/13/19 2059 (!) 116     Resp 06/13/19 2059 16     Temp 06/13/19 2059 98.8 F (37.1 C)     Temp Source 06/13/19 2059 Oral     SpO2 06/13/19 2059 96 %     Weight --      Height 06/14/19 0600 5\' 8"  (1.727 m)    Constitutional: Alert and oriented. Well appearing and in no acute distress. Eyes: Conjunctivae are normal.  PERRL. EOMI. Head: Atraumatic. Nose: No congestion/rhinnorhea. Mouth/Throat: Mucous membranes are dry.  Oropharynx non-erythematous. Neck: No stridor.  No meningeal signs.   Cardiovascular: Tachycardic rate, regular rhythm. Good peripheral circulation. Grossly normal heart sounds.   Respiratory: Normal respiratory effort.  No retractions. Lungs diminished bilaterally.. Gastrointestinal: Soft and nontender. No distention.  Musculoskeletal: No lower extremity tenderness nor edema. No gross deformities of extremities. Neurologic:  Normal speech and language. No gross focal neurologic deficits are appreciated.  Skin:  Skin is warm, dry and intact. No rash noted.   ____________________________________________   LABS (all labs ordered are listed,  but only abnormal results are displayed)  Labs Reviewed  POC SARS CORONAVIRUS 2 AG -  ED - Abnormal; Notable for the following components:      Result Value   SARS Coronavirus 2 Ag POSITIVE (*)    All other components within normal limits   ____________________________________________  RADIOLOGY  DG Chest Portable 1 View  Result Date: 06/13/2019 CLINICAL DATA:  Cough, shortness of breath EXAM: PORTABLE CHEST 1 VIEW COMPARISON:  09/16/2013 FINDINGS: The heart size and mediastinal contours are within normal limits. Subtle patchy airspace opacities within the right upper lobe suspicious for pneumonia. No pleural effusion. No pneumothorax. Degenerative changes of the right glenohumeral joint. IMPRESSION: Subtle patchy airspace opacities within the right upper lobe suspicious for developing pneumonia. Electronically Signed   By: Davina Poke D.O.   On: 06/13/2019 22:00    ____________________________________________   PROCEDURES  Procedure(s) performed:   Procedures   ____________________________________________   INITIAL IMPRESSION / ASSESSMENT AND PLAN / ED COURSE   Patient with Covid.  No hypoxic respiratory failure.  Will give some symptomatic treatment here make sure he can tolerate p.o. and see if we get his cough and to improve.  X-ray consistent with pneumonia but he is Covid positive without a productive cough or fever so do not think this is bacterial pneumonia.  Will treat with supportive care at home after hydration.  Care transferred pending reeval after fluids. Likely discharge at that time.     Pertinent labs & imaging results that were available during my care of the patient were reviewed by me and considered in my medical decision making (see chart for details).  ____________________________________________  FINAL CLINICAL IMPRESSION(S) / ED DIAGNOSES  Final diagnoses:  None     MEDICATIONS GIVEN DURING THIS VISIT:  Medications  lactated ringers  bolus 1,000 mL (has no administration in time range)  benzonatate (TESSALON) capsule 100 mg (has no administration in time range)  albuterol (VENTOLIN HFA) 108 (90 Base) MCG/ACT inhaler 2 puff (has no administration in time range)  ondansetron (ZOFRAN) injection 4 mg (has no administration in time range)     NEW OUTPATIENT MEDICATIONS STARTED DURING THIS VISIT:  New Prescriptions   No medications on file    Note:  This note was prepared with assistance of Dragon voice recognition software. Occasional wrong-word or sound-a-like substitutions may have occurred due to the inherent limitations of voice recognition software.   Yates Weisgerber, Corene Cornea, MD 06/15/19 [phone]

## 2019-06-14 NOTE — Discharge Instructions (Addendum)
Drink plenty of fluids and get plenty of rest.  Tylenol 1000 mg every 6 hours as needed for fever.  Return to the emergency department if you develop difficulty breathing, severe chest pain, or other new and concerning symptoms.        Infection Prevention Recommendations for Individuals Confirmed to have, or Being Evaluated for, 2019 Novel Coronavirus (COVID-19) Infection Who Receive Care at Home  Individuals who are confirmed to have, or are being evaluated for, COVID-19 should follow the prevention steps below until a healthcare provider or local or state health department says they can return to normal activities.  Stay home except to get medical care You should restrict activities outside your home, except for getting medical care. Do not go to work, school, or public areas, and do not use public transportation or taxis.  Call ahead before visiting your doctor Before your medical appointment, call the healthcare provider and tell them that you have, or are being evaluated for, COVID-19 infection. This will help the healthcare providers office take steps to keep other people from getting infected. Ask your healthcare provider to call the local or state health department.  Monitor your symptoms Seek prompt medical attention if your illness is worsening (e.g., difficulty breathing). Before going to your medical appointment, call the healthcare provider and tell them that you have, or are being evaluated for, COVID-19 infection. Ask your healthcare provider to call the local or state health department.  Wear a facemask You should wear a facemask that covers your nose and mouth when you are in the same room with other people and when you visit a healthcare provider. People who live with or visit you should also wear a facemask while they are in the same room with you.  Separate yourself from other people in your home As much as possible, you should stay in a different room  from other people in your home. Also, you should use a separate bathroom, if available.  Avoid sharing household items You should not share dishes, drinking glasses, cups, eating utensils, towels, bedding, or other items with other people in your home. After using these items, you should wash them thoroughly with soap and water.  Cover your coughs and sneezes Cover your mouth and nose with a tissue when you cough or sneeze, or you can cough or sneeze into your sleeve. Throw used tissues in a lined trash can, and immediately wash your hands with soap and water for at least 20 seconds or use an alcohol-based hand rub.  Wash your [facility] your hands often and thoroughly with soap and water for at least 20 seconds. You can use an alcohol-based hand sanitizer if soap and water are not available and if your hands are not visibly dirty. Avoid touching your eyes, nose, and mouth with unwashed hands.   Prevention Steps for Caregivers and Household Members of Individuals Confirmed to have, or Being Evaluated for, COVID-19 Infection Being Cared for in the Home  If you live with, or provide care at home for, a person confirmed to have, or being evaluated for, COVID-19 infection please follow these guidelines to prevent infection:  Follow healthcare providers instructions Make sure that you understand and can help the patient follow any healthcare provider instructions for all care.  Provide for the patients basic needs You should help the patient with basic needs in the home and provide support for getting groceries, prescriptions, and other personal needs.  Monitor the patients symptoms If they are getting  sicker, call his or her medical provider and tell them that the patient has, or is being evaluated for, COVID-19 infection. This will help the healthcare providers office take steps to keep other people from getting infected. Ask the healthcare provider to call the local or state  health department.  Limit the number of people who have contact with the patient If possible, have only one caregiver for the patient. Other household members should stay in another home or place of residence. If this is not possible, they should stay in another room, or be separated from the patient as much as possible. Use a separate bathroom, if available. Restrict visitors who do not have an essential need to be in the home.  Keep older adults, very young children, and other sick people away from the patient Keep older adults, very young children, and those who have compromised immune systems or chronic health conditions away from the patient. This includes people with chronic heart, lung, or kidney conditions, diabetes, and cancer.  Ensure good ventilation Make sure that shared spaces in the home have good air flow, such as from an air conditioner or an opened window, weather permitting.  Wash your hands often Wash your hands often and thoroughly with soap and water for at least 20 seconds. You can use an alcohol based hand sanitizer if soap and water are not available and if your hands are not visibly dirty. Avoid touching your eyes, nose, and mouth with unwashed hands. Use disposable paper towels to dry your hands. If not available, use dedicated cloth towels and replace them when they become wet.  Wear a facemask and gloves Wear a disposable facemask at all times in the room and gloves when you touch or have contact with the patients blood, body fluids, and/or secretions or excretions, such as sweat, saliva, sputum, nasal mucus, vomit, urine, or feces.  Ensure the mask fits over your nose and mouth tightly, and do not touch it during use. Throw out disposable facemasks and gloves after using them. Do not reuse. Wash your hands immediately after removing your facemask and gloves. If your personal clothing becomes contaminated, carefully remove clothing and launder. Wash your hands  after handling contaminated clothing. Place all used disposable facemasks, gloves, and other waste in a lined container before disposing them with other household waste. Remove gloves and wash your hands immediately after handling these items.  Do not share dishes, glasses, or other household items with the patient Avoid sharing household items. You should not share dishes, drinking glasses, cups, eating utensils, towels, bedding, or other items with a patient who is confirmed to have, or being evaluated for, COVID-19 infection. After the person uses these items, you should wash them thoroughly with soap and water.  Wash laundry thoroughly Immediately remove and wash clothes or bedding that have blood, body fluids, and/or secretions or excretions, such as sweat, saliva, sputum, nasal mucus, vomit, urine, or feces, on them. Wear gloves when handling laundry from the patient. Read and follow directions on labels of laundry or clothing items and detergent. In general, wash and dry with the warmest temperatures recommended on the label.  Clean all areas the individual has used often Clean all touchable surfaces, such as counters, tabletops, doorknobs, bathroom fixtures, toilets, phones, keyboards, tablets, and bedside tables, every day. Also, clean any surfaces that may have blood, body fluids, and/or secretions or excretions on them. Wear gloves when cleaning surfaces the patient has come in contact with. Use a diluted bleach  solution (e.g., dilute bleach with 1 part bleach and 10 parts water) or a household disinfectant with a label that says EPA-registered for coronaviruses. To make a bleach solution at home, add 1 tablespoon of bleach to 1 quart (4 cups) of water. For a larger supply, add  cup of bleach to 1 gallon (16 cups) of water. Read labels of cleaning products and follow recommendations provided on product labels. Labels contain instructions for safe and effective use of the cleaning product  including precautions you should take when applying the product, such as wearing gloves or eye protection and making sure you have good ventilation during use of the product. Remove gloves and wash hands immediately after cleaning.  Monitor yourself for signs and symptoms of illness Caregivers and household members are considered close contacts, should monitor their health, and will be asked to limit movement outside of the home to the extent possible. Follow the monitoring steps for close contacts listed on the symptom monitoring form.   ? If you have additional questions, contact your local health department or call the epidemiologist on call at [phone] (available 24/7). ? This guidance is subject to change. For the most up-to-date guidance from [facility], please refer to their website: [email or web address]

## 2019-06-14 NOTE — ED Notes (Signed)
Pt reports his ride will not be here for 30-45 minutes.

## 2019-06-14 NOTE — ED Notes (Signed)
Patient verbalizes understanding of discharge instructions. Opportunity for questioning and answers were provided. Armband removed by staff, pt discharged from ED.  

## 2019-06-14 NOTE — ED Provider Notes (Signed)
Care assumed from Dr. Clayborne Dana at shift change awaiting completion of IV fluids.  Patient with newly diagnosed COVID-19.  IV fluids have completed appears appropriate for discharge.  To follow-up as needed for any problems.   Geoffery Lyons, MD 06/14/19 [phone]

## 2019-06-14 NOTE — ED Notes (Signed)
Notified charge nurse woody rn patient covid 19 poc  postive

## 2019-06-15 ENCOUNTER — Other Ambulatory Visit: Payer: Self-pay | Admitting: Nurse Practitioner

## 2019-06-15 DIAGNOSIS — U071 COVID-19: Secondary | ICD-10-CM

## 2019-06-15 NOTE — Progress Notes (Signed)
  I connected by phone with Jordan West on 06/15/2019 at 1:11 PM to discuss the potential use of an new treatment for mild to moderate COVID-19 viral infection in non-hospitalized patients.  This patient is a 74 y.o. male that meets the FDA criteria for Emergency Use Authorization of bamlanivimab or casirivimab\imdevimab.  Has a (+) direct SARS-CoV-2 viral test result  Has mild or moderate COVID-19   Is ? 75 years of age and weighs ? 40 kg  Is NOT hospitalized due to COVID-19  Is NOT requiring oxygen therapy or requiring an increase in baseline oxygen flow rate due to COVID-19  Is within 10 days of symptom onset  Has at least one of the high risk factor(s) for progression to severe COVID-19 and/or hospitalization as defined in EUA.  Specific high risk criteria : >/= 75 yo   I have spoken and communicated the following to the patient or parent/caregiver:  1. FDA has authorized the emergency use of bamlanivimab and casirivimab\imdevimab for the treatment of mild to moderate COVID-19 in adults and pediatric patients with positive results of direct SARS-CoV-2 viral testing who are 63 years of age and older weighing at least 40 kg, and who are at high risk for progressing to severe COVID-19 and/or hospitalization.  2. The significant known and potential risks and benefits of bamlanivimab and casirivimab\imdevimab, and the extent to which such potential risks and benefits are unknown.  3. Information on available alternative treatments and the risks and benefits of those alternatives, including clinical trials.  4. Patients treated with bamlanivimab and casirivimab\imdevimab should continue to self-isolate and use infection control measures (e.g., wear mask, isolate, social distance, avoid sharing personal items, clean and disinfect "high touch" surfaces, and frequent handwashing) according to CDC guidelines.   5. The patient or parent/caregiver has the option to accept or refuse  bamlanivimab or casirivimab\imdevimab .  After reviewing this information with the patient, The patient agreed to proceed with receiving the bamlanimivab infusion and will be provided a copy of the Fact sheet prior to receiving the infusion.[name] 06/15/2019 1:11 PM  Note: Patient states that symptoms started on 06/10/19

## 2019-06-18 ENCOUNTER — Ambulatory Visit (HOSPITAL_COMMUNITY)
Admission: RE | Admit: 2019-06-18 | Discharge: 2019-06-18 | Disposition: A | Discharge status: Home / Self Care | Payer: Medicare Other | Source: Ambulatory Visit | Attending: Pulmonary Disease | Admitting: Pulmonary Disease

## 2019-06-18 DIAGNOSIS — U071 COVID-19: Secondary | ICD-10-CM

## 2019-06-18 DIAGNOSIS — Z23 Encounter for immunization: Secondary | ICD-10-CM | POA: Insufficient documentation

## 2019-06-18 MED ORDER — FAMOTIDINE IN NACL 20-0.9 MG/50ML-% IV SOLN: 20.0000 mg | Freq: Once | INTRAVENOUS | Status: DC | PRN

## 2019-06-18 MED ORDER — METHYLPREDNISOLONE SODIUM SUCC 125 MG IJ SOLR: 125.0000 mg | Freq: Once | INTRAMUSCULAR | Status: DC | PRN

## 2019-06-18 MED ORDER — EPINEPHRINE 0.3 MG/0.3ML IJ SOAJ: 0.3000 mg | Freq: Once | INTRAMUSCULAR | Status: DC | PRN

## 2019-06-18 MED ORDER — SODIUM CHLORIDE 0.9 % IV SOLN
700.0000 mg | Freq: Once | INTRAVENOUS | Status: AC
  Administered 2019-06-18: 700 mg via INTRAVENOUS
  Filled 2019-06-18: qty 20

## 2019-06-18 MED ORDER — DIPHENHYDRAMINE HCL 50 MG/ML IJ SOLN: 50.0000 mg | Freq: Once | INTRAMUSCULAR | Status: DC | PRN

## 2019-06-18 MED ORDER — SODIUM CHLORIDE 0.9 % IV SOLN
INTRAVENOUS | Status: DC | PRN
  Administered 2019-06-18: 250 mL via INTRAVENOUS

## 2019-06-18 MED ORDER — ALBUTEROL SULFATE HFA 108 (90 BASE) MCG/ACT IN AERS: 2.0000 | INHALATION_SPRAY | Freq: Once | RESPIRATORY_TRACT | Status: DC | PRN

## 2019-06-18 NOTE — Discharge Instructions (Signed)

## 2019-06-18 NOTE — Progress Notes (Signed)
  Diagnosis: COVID-19  Physician:Dr Wright  Procedure: Covid Infusion Clinic Med: bamlanivimab infusion - Provided patient with bamlanimivab fact sheet for patients, parents and caregivers prior to infusion.  Complications: No immediate complications noted.  Discharge: Discharged home   Jordan West 06/18/2019  

## 2019-09-22 ENCOUNTER — Ambulatory Visit: Payer: Medicare Other | Attending: Internal Medicine

## 2019-09-22 DIAGNOSIS — Z23 Encounter for immunization: Secondary | ICD-10-CM

## 2019-09-22 NOTE — Progress Notes (Signed)
   Covid-19 Vaccination Clinic  Name:  TAYVION LAUDER    MRN: [id number] DOB: 1946-04-22  09/22/2019  Mr. Seifried was observed post Covid-19 immunization for 15 minutes without incident. He was provided with Vaccine Information Sheet and instruction to access the V-Safe system.   Mr. Sheehan was instructed to call 911 with any severe reactions post vaccine: Marland Kitchen Difficulty breathing  . Swelling of face and throat  . A fast heartbeat  . A bad rash all over body  . Dizziness and weakness   Immunizations Administered    Name Date Dose VIS Date Route   Pfizer COVID-19 Vaccine 09/22/2019  9:16 AM 0.3 mL 07/27/2018 Intramuscular   Manufacturer: [employer], [employer]   Lot: [id number]   NDC: [address]-6

## 2019-10-17 ENCOUNTER — Ambulatory Visit: Payer: Medicare Other | Attending: Internal Medicine

## 2019-10-17 DIAGNOSIS — Z23 Encounter for immunization: Secondary | ICD-10-CM

## 2019-10-17 NOTE — Progress Notes (Signed)
   Covid-19 Vaccination Clinic  Name:  Jordan West    MRN: [id number] DOB: [birth year]  10/17/2019  [name] was observed post Covid-19 immunization for 15 minutes without incident. He was provided with Vaccine Information Sheet and instruction to access the V-Safe system.   [name] was instructed to call 911 with any severe reactions post vaccine: Marland Kitchen Difficulty breathing  . Swelling of face and throat  . A fast heartbeat  . A bad rash all over body  . Dizziness and weakness   Immunizations Administered    Name Date Dose VIS Date Route   Pfizer COVID-19 Vaccine 10/17/2019  8:38 AM 0.3 mL 07/27/2018 Intramuscular   Manufacturer: [employer], [employer]   Lot: [id number]   NDC: [address]-1

## 2020-09-11 ENCOUNTER — Other Ambulatory Visit: Payer: Self-pay

## 2020-09-11 ENCOUNTER — Ambulatory Visit: Payer: Medicare Other | Attending: Internal Medicine

## 2020-09-11 DIAGNOSIS — Z23 Encounter for immunization: Secondary | ICD-10-CM

## 2020-09-11 NOTE — Progress Notes (Signed)
   Covid-19 Vaccination Clinic  Name:  Jordan West    MRN: [id number] DOB: 06/03/1945  09/11/2020  [name] was observed post Covid-19 immunization for 15 minutes without incident. He was provided with Vaccine Information Sheet and instruction to access the V-Safe system.   [name] was instructed to call 911 with any severe reactions post vaccine: Marland Kitchen Difficulty breathing  . Swelling of face and throat  . A fast heartbeat  . A bad rash all over body  . Dizziness and weakness   Immunizations Administered    Name Date Dose VIS Date Route   PFIZER Comrnaty(Gray TOP) Covid-19 Vaccine 09/11/2020 11:24 AM 0.3 mL 05/10/2020 Intramuscular   Manufacturer: [employer], [employer]   Lot: [id number]   NDC: [id number]

## 2020-09-14 ENCOUNTER — Other Ambulatory Visit (HOSPITAL_BASED_OUTPATIENT_CLINIC_OR_DEPARTMENT_OTHER): Payer: Self-pay

## 2020-09-14 MED ORDER — COVID-19 MRNA VACCINE (PFIZER) 30 MCG/0.3ML IM SUSP
INTRAMUSCULAR | 0 refills | Status: DC
  Filled 2020-09-14: qty 0.3, 1d supply, fill #0

## 2021-02-02 IMAGING — DX DG CHEST 1V PORT
1 series · 1 of 1 positions shown · non-contrast
Comparison: 09/16/2013

CLINICAL DATA: Cough, shortness of breath

EXAM:
PORTABLE CHEST 1 VIEW

[chest ap]
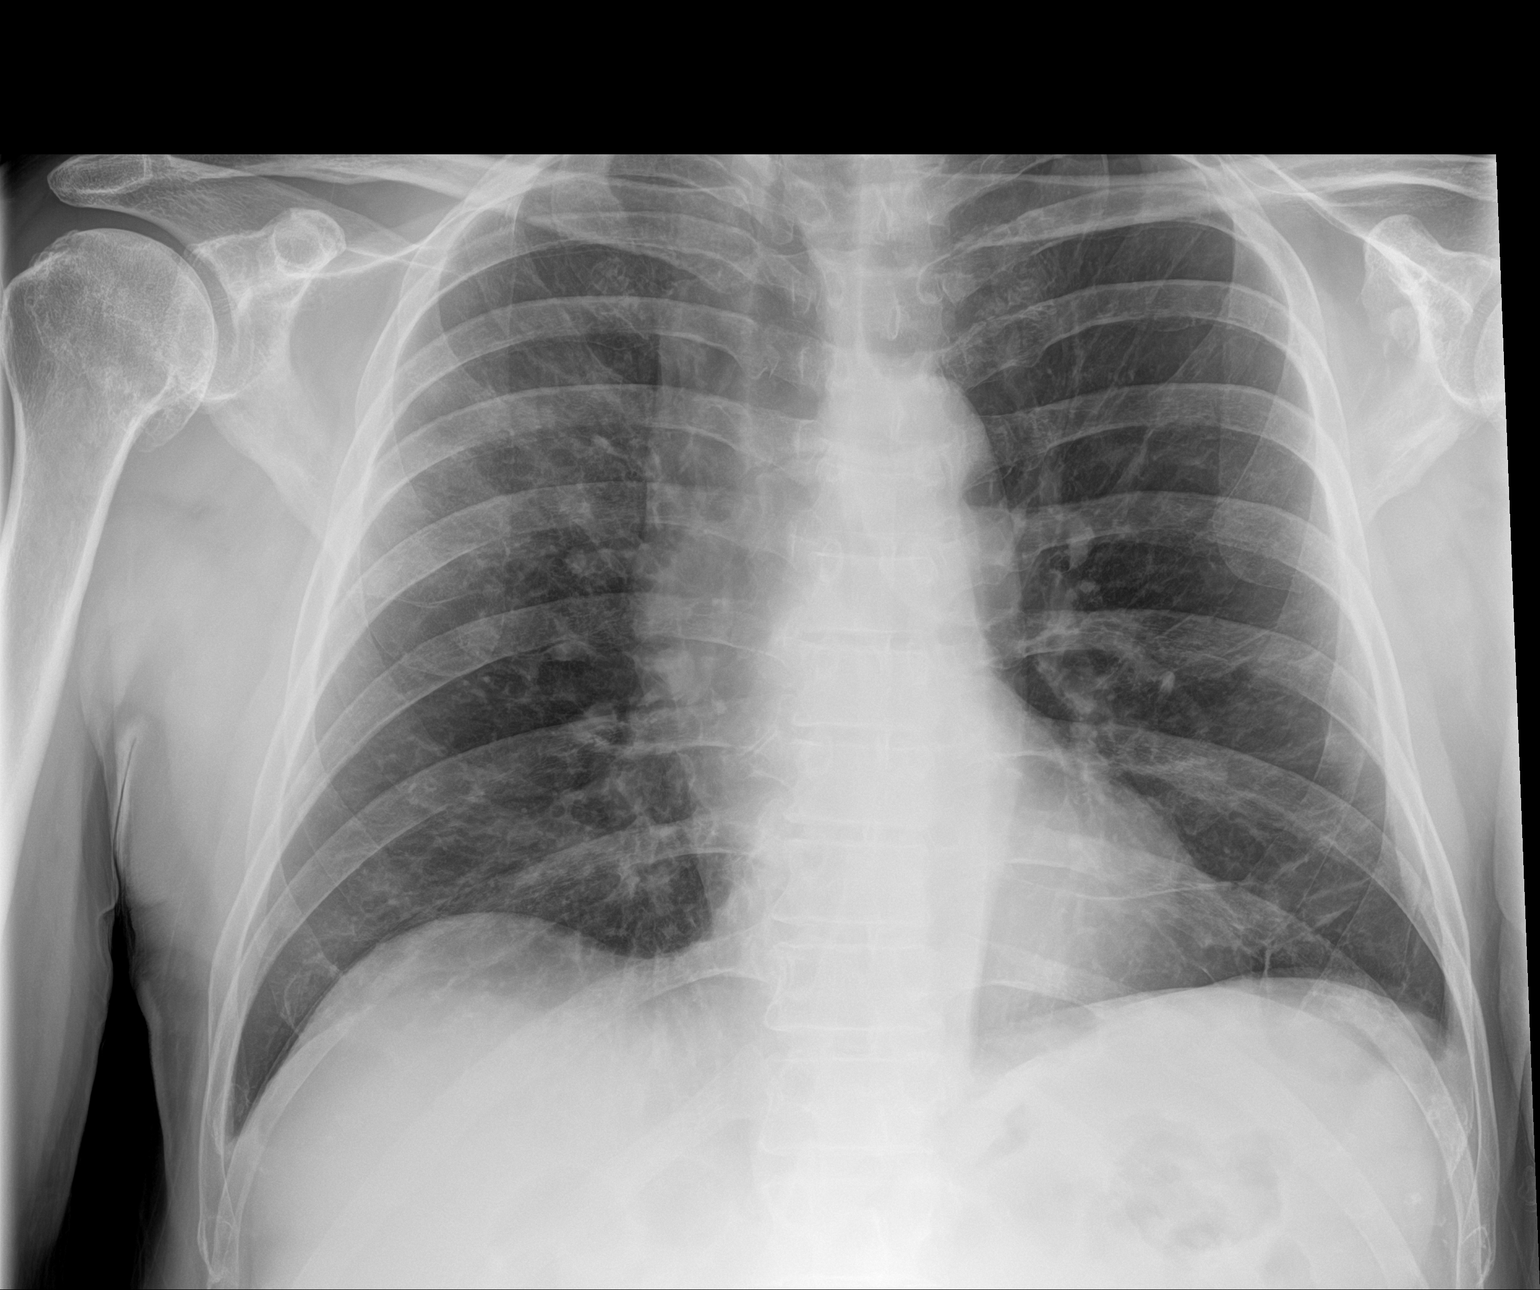

[1 of 1 positions shown; findings below may reference images not displayed]

FINDINGS: The heart size and mediastinal contours are within normal limits.
Subtle patchy airspace opacities within the right upper lobe
suspicious for pneumonia. No pleural effusion. No pneumothorax.
Degenerative changes of the right glenohumeral joint.
IMPRESSION: Subtle patchy airspace opacities within the right upper lobe
suspicious for developing pneumonia.

## 2021-07-31 DIAGNOSIS — E119 Type 2 diabetes mellitus without complications: Secondary | ICD-10-CM | POA: Diagnosis not present

## 2021-07-31 DIAGNOSIS — H40013 Open angle with borderline findings, low risk, bilateral: Secondary | ICD-10-CM | POA: Diagnosis not present

## 2021-07-31 DIAGNOSIS — Z961 Presence of intraocular lens: Secondary | ICD-10-CM | POA: Diagnosis not present

## 2021-08-01 DIAGNOSIS — D225 Melanocytic nevi of trunk: Secondary | ICD-10-CM | POA: Diagnosis not present

## 2021-08-01 DIAGNOSIS — C44319 Basal cell carcinoma of skin of other parts of face: Secondary | ICD-10-CM | POA: Diagnosis not present

## 2021-08-01 DIAGNOSIS — L821 Other seborrheic keratosis: Secondary | ICD-10-CM | POA: Diagnosis not present

## 2021-08-01 DIAGNOSIS — Z85828 Personal history of other malignant neoplasm of skin: Secondary | ICD-10-CM | POA: Diagnosis not present

## 2021-08-01 DIAGNOSIS — D2261 Melanocytic nevi of right upper limb, including shoulder: Secondary | ICD-10-CM | POA: Diagnosis not present

## 2021-09-04 DIAGNOSIS — C441122 Basal cell carcinoma of skin of right lower eyelid, including canthus: Secondary | ICD-10-CM | POA: Diagnosis not present

## 2021-10-09 DIAGNOSIS — J4530 Mild persistent asthma, uncomplicated: Secondary | ICD-10-CM | POA: Diagnosis not present

## 2021-10-09 DIAGNOSIS — I10 Essential (primary) hypertension: Secondary | ICD-10-CM | POA: Diagnosis not present

## 2021-10-09 DIAGNOSIS — E1165 Type 2 diabetes mellitus with hyperglycemia: Secondary | ICD-10-CM | POA: Diagnosis not present

## 2021-10-09 DIAGNOSIS — E7800 Pure hypercholesterolemia, unspecified: Secondary | ICD-10-CM | POA: Diagnosis not present

## 2022-01-06 DIAGNOSIS — Z0000 Encounter for general adult medical examination without abnormal findings: Secondary | ICD-10-CM | POA: Diagnosis not present

## 2022-02-26 ENCOUNTER — Ambulatory Visit (INDEPENDENT_AMBULATORY_CARE_PROVIDER_SITE_OTHER): Payer: Medicare Other | Admitting: Family

## 2022-02-26 ENCOUNTER — Encounter: Payer: Self-pay | Admitting: Family

## 2022-02-26 VITALS — BP 134/60 | HR 102 | Temp 97.7°F | Resp 16 | Ht 68.0 in | Wt 165.4 lb

## 2022-02-26 DIAGNOSIS — I129 Hypertensive chronic kidney disease with stage 1 through stage 4 chronic kidney disease, or unspecified chronic kidney disease: Secondary | ICD-10-CM

## 2022-02-26 DIAGNOSIS — R002 Palpitations: Secondary | ICD-10-CM

## 2022-02-26 DIAGNOSIS — R252 Cramp and spasm: Secondary | ICD-10-CM

## 2022-02-26 DIAGNOSIS — R42 Dizziness and giddiness: Secondary | ICD-10-CM

## 2022-02-26 DIAGNOSIS — E785 Hyperlipidemia, unspecified: Secondary | ICD-10-CM | POA: Diagnosis not present

## 2022-02-26 DIAGNOSIS — Z7689 Persons encountering health services in other specified circumstances: Secondary | ICD-10-CM | POA: Diagnosis not present

## 2022-02-26 DIAGNOSIS — E1169 Type 2 diabetes mellitus with other specified complication: Secondary | ICD-10-CM

## 2022-02-26 DIAGNOSIS — Z23 Encounter for immunization: Secondary | ICD-10-CM | POA: Diagnosis not present

## 2022-02-26 DIAGNOSIS — N183 Chronic kidney disease, stage 3 unspecified: Secondary | ICD-10-CM

## 2022-02-26 DIAGNOSIS — J4520 Mild intermittent asthma, uncomplicated: Secondary | ICD-10-CM

## 2022-02-26 NOTE — Progress Notes (Signed)
Provider: Marlowe Sax FNP-C   Jordan West, Nelda Bucks, NP  Patient Care Team: Venia Riveron, Nelda Bucks, NP as PCP - General (Family Medicine)  Extended Emergency Contact Information Primary Emergency Contact: Fitzhenry,Renee Address: [place] [address]          [place], [place] [address] Johnnette Litter of [place] Phone: [phone] Relation: Daughter Secondary Emergency Contact: Schorsch,Beverly Address: [address]          [place], [place] [address] Johnnette Litter of [place] Phone: [phone] Relation: Spouse  Code Status:  Full Code  Goals of care: Advanced Directive information    02/26/2022   10:07 AM  Advanced Directives  Does Patient Have a Medical Advance Directive? No  Would patient like information on creating a medical advance directive? No - Patient declined     Chief Complaint  Patient presents with   Establish Care    New Patient.     HPI:  Pt is a 76 y.o. male seen today establish care here at [facility] and Adult  care for medical management of chronic diseases. He is here with the wife, his son is has dementia and cares for home.   Hypertension - B/p runs 130's /70- 150's /80's denies any headache,dizziness,vision changes,fatigue,chest tightness,palpitation,chest pain or shortness of breath.      Type 2 DM - CBG runs in the 160's.checks blood sugars once daily.  Denies any symptoms of hypo or hyperglycemia.  No recent follow-up with podiatry or ophthalmology.  Leg cramps on thigh - Gabapentin effective   Asthma - on Advair and Albuterol.Has not required at least 1-2 times per yr.  No hx of smoking.   Hyperlipidemia - on atorvastatin. 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   Eczema - on vit D supplement   Lower back pain and knee pain - takes aleve Had surgeries in 2015 denies any numbness tingling or weakness in the extremities  Due for Tdap,Shingles and COVID-19 made aware  to get vaccine at the pharmacy   Past Medical History:  Diagnosis Date   Asthma    Diabetes mellitus without complication ([place])    GERD (gastroesophageal reflux disease)    Hx of hypercholesterolemia    PMH   Hypertension    Pneumonia    Past Surgical History:  Procedure Laterality Date   COLONOSCOPY     DECOMPRESSIVE LUMBAR LAMINECTOMY LEVEL 2 N/A 09/14/2013   Procedure: Lumbar Four-Sacral One Decompression and Transforaminal Lumbar Four-Five Interbody and Lumbar Four-Sacral One Fusion;  Surgeon: Melina Schools, MD;  Location: [facility];  Service: Orthopedics;  Laterality: N/A;   FRACTURE SURGERY     left pinky   LUNG SURGERY      Allergies  Allergen Reactions   Amlodipine     rash   Aspirin Other (See Comments)    Bleeding in GI tract    Allergies as of 02/26/2022       Reactions   Amlodipine    rash   Aspirin Other (See Comments)   Bleeding in GI tract        Medication List        Accurate as of February 26, 2022 10:24 AM. If you have any questions, ask your nurse or doctor.          STOP taking these medications    ciprofloxacin 500 MG tablet Commonly known as: Cipro Stopped by:  Ahan Eisenberger C Keiton Cosma, NP   docusate sodium 100 MG capsule Commonly known as: Colace Stopped by: Sandrea Hughs, NP   methocarbamol 500 MG tablet Commonly known as: ROBAXIN Stopped by: Nelda Bucks Claudette Wermuth, NP   ondansetron 4 MG disintegrating tablet Commonly known as: Zofran ODT Stopped by: Sandrea Hughs, NP   oxyCODONE-acetaminophen 10-325 MG tablet Commonly known as: Percocet Stopped by: Sandrea Hughs, NP   Pfizer-BioNTech COVID-19 Vacc 30 MCG/0.3ML injection Generic drug: COVID-19 mRNA vaccine Therapist, music) Stopped by: Sandrea Hughs, NP   polyethylene glycol 17 g packet Commonly known as: MIRALAX / GLYCOLAX Stopped by: Sandrea Hughs, NP       TAKE these medications    albuterol 108 (90 Base) MCG/ACT inhaler Commonly known as: VENTOLIN HFA Inhale 1 puff into  the lungs every 6 (six) hours as needed for wheezing or shortness of breath.   atorvastatin 20 MG tablet Commonly known as: LIPITOR Take 20 mg by mouth daily.   cholecalciferol 25 MCG (1000 UNIT) tablet Commonly known as: VITAMIN D3 Take 1,000 Units by mouth daily.   Fluticasone-Salmeterol 500-50 MCG/DOSE Aepb Commonly known as: ADVAIR Inhale 1 puff into the lungs 2 (two) times daily.   gabapentin 300 MG capsule Commonly known as: NEURONTIN Take 300 mg by mouth 2 (two) times daily.   glimepiride 1 MG tablet Commonly known as: AMARYL Take 1 mg by mouth daily.   glucose blood test strip as directed.   hydrochlorothiazide 25 MG tablet Commonly known as: HYDRODIURIL Take 25 mg by mouth daily.   losartan 100 MG tablet Commonly known as: COZAAR Take 100 mg by mouth every evening.   metFORMIN 1000 MG tablet Commonly known as: GLUCOPHAGE Take 1,000 mg by mouth 2 (two) times daily with a meal.   multivitamin with minerals Tabs tablet Take 1 tablet by mouth daily.   naproxen sodium 220 MG tablet Commonly known as: ALEVE Take 220 mg by mouth 2 (two) times daily as needed (pain).   omeprazole 20 MG capsule Commonly known as: PRILOSEC Take 20 mg by mouth daily before breakfast.   onetouch ultrasoft lancets as directed.   PREVAGEN PO Take 1 tablet by mouth daily.        Review of Systems  Constitutional:  Negative for appetite change, chills, fatigue, fever and unexpected weight change.  HENT:  Negative for congestion, dental problem, ear discharge, ear pain, facial swelling, hearing loss, nosebleeds, postnasal drip, rhinorrhea, sinus pressure, sinus pain, sneezing, sore throat, tinnitus and trouble swallowing.   Eyes:  Negative for pain, discharge, redness, itching and visual disturbance.  Respiratory:  Negative for cough, chest tightness, shortness of breath and wheezing.   Cardiovascular:  Negative for chest pain, palpitations and leg swelling.  Gastrointestinal:   Negative for abdominal distention, abdominal pain, blood in stool, constipation, diarrhea, nausea and vomiting.  Endocrine: Negative for cold intolerance, heat intolerance, polydipsia, polyphagia and polyuria.  Genitourinary:  Negative for difficulty urinating, dysuria, flank pain, frequency and urgency.  Musculoskeletal:  Positive for arthralgias and back pain. Negative for gait problem, joint swelling, myalgias, neck pain and neck stiffness.  Skin:  Negative for color change, pallor, rash and wound.  Neurological:  Negative for dizziness, syncope, speech difficulty, weakness, light-headedness, numbness and headaches.  Hematological:  Does not bruise/bleed easily.  Psychiatric/Behavioral:  Negative for agitation, behavioral problems, confusion, hallucinations, self-injury, sleep disturbance and suicidal ideas. The patient is not nervous/anxious.     Immunization History  Administered Date(s) Administered   Influenza, Seasonal,  Injecte, Preservative Fre 03/24/2011   Influenza,inj,Quad PF,6+ Mos 03/18/2017, 02/17/2018, 02/09/2019, 04/10/2020   Influenza,inj,quad, With Preservative 03/29/2018   Influenza-Unspecified 06/03/1999, 04/17/2010, 03/19/2015, 05/02/2016, 03/19/2021   PFIZER Comirnaty(Gray Top)Covid-19 Tri-Sucrose Vaccine 09/11/2020   PFIZER(Purple Top)SARS-COV-2 Vaccination 09/22/2019, 10/17/2019, 04/18/2020   Pneumococcal Conjugate-13 06/03/2015   Pneumococcal Polysaccharide-23 04/17/2010, 08/05/2016   Pneumococcal-Unspecified 07/14/2016   Td (Adult),unspecified 01/07/2013   Pertinent  Health Maintenance Due  Topic Date Due   INFLUENZA VACCINE  12/31/2021      06/13/2019    9:08 PM 06/14/2019    6:01 AM 02/26/2022   10:07 AM  Fall Risk  Falls in the past year?   0  Was there an injury with Fall?   0  Fall Risk Category Calculator   0  Fall Risk Category   Low  Patient Fall Risk Level Low fall risk Low fall risk Low fall risk  Patient at Risk for Falls Due to   No Fall Risks   Fall risk Follow up   Falls evaluation completed   Functional Status Survey:    Vitals:   02/26/22 0955  BP: 134/60  Pulse: (!) 102  Resp: 16  Temp: 97.7 F (36.5 C)  SpO2: 96%  Weight: 165 lb 6.4 oz (75 kg)  Height: 5\' 8"  (1.727 m)   Body mass index is 25.15 kg/m. Physical Exam Vitals reviewed.  Constitutional:      General: He is not in acute distress.    Appearance: Normal appearance. He is overweight. He is not ill-appearing or diaphoretic.  HENT:     Head: Normocephalic.     Right Ear: Tympanic membrane, ear canal and external ear normal. There is no impacted cerumen.     Left Ear: Tympanic membrane, ear canal and external ear normal. There is no impacted cerumen.     Nose: Nose normal. No congestion or rhinorrhea.     Mouth/Throat:     Mouth: Mucous membranes are moist.     Pharynx: Oropharynx is clear. No oropharyngeal exudate or posterior oropharyngeal erythema.  Eyes:     General: No scleral icterus.       Right eye: No discharge.        Left eye: No discharge.     Extraocular Movements: Extraocular movements intact.     Conjunctiva/sclera: Conjunctivae normal.     Pupils: Pupils are equal, round, and reactive to light.  Neck:     Vascular: No carotid bruit.  Cardiovascular:     Rate and Rhythm: Normal rate and regular rhythm.     Pulses: Normal pulses.     Heart sounds: Normal heart sounds. No murmur heard.    No friction rub. No gallop.  Pulmonary:     Effort: Pulmonary effort is normal. No respiratory distress.     Breath sounds: Normal breath sounds. No wheezing, rhonchi or rales.  Chest:     Chest wall: No tenderness.  Abdominal:     General: Bowel sounds are normal. There is no distension.     Palpations: Abdomen is soft. There is no mass.     Tenderness: There is no abdominal tenderness. There is no right CVA tenderness, left CVA tenderness, guarding or rebound.  Musculoskeletal:        General: No swelling or tenderness. Normal range of  motion.     Cervical back: Normal range of motion. No rigidity or tenderness.     Right lower leg: No edema.     Left lower leg: No edema.  Lymphadenopathy:     Cervical: No cervical adenopathy.  Skin:    General: Skin is warm and dry.     Coloration: Skin is not pale.     Findings: No bruising, erythema, lesion or rash.  Neurological:     Mental Status: He is alert and oriented to person, place, and time.     Cranial Nerves: No cranial nerve deficit.     Sensory: No sensory deficit.     Motor: No weakness.     Coordination: Coordination normal.     Gait: Gait normal.  Psychiatric:        Mood and Affect: Mood normal.        Speech: Speech normal.        Behavior: Behavior normal.        Thought Content: Thought content normal.        Judgment: Judgment normal.     Labs reviewed: No results for input(s): "NA", "K", "CL", "CO2", "GLUCOSE", "BUN", "CREATININE", "CALCIUM", "MG", "PHOS" in the last 8760 hours. No results for input(s): "AST", "ALT", "ALKPHOS", "BILITOT", "PROT", "ALBUMIN" in the last 8760 hours. No results for input(s): "WBC", "NEUTROABS", "HGB", "HCT", "MCV", "PLT" in the last 8760 hours. No results found for: "TSH" Lab Results  Component Value Date   HGBA1C 7.3 (H) 09/14/2013   No results found for: "CHOL", "HDL", "LDLCALC", "LDLDIRECT", "TRIG", "CHOLHDL"  Significant Diagnostic Results in last 30 days:  No results found.  Assessment/Plan 1. Need for influenza vaccination Flut shot administered by CMA no acute reaction reported.  - Flu Vaccine QUAD High Dose(Fluad)  2. 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  -We will refer him to cardiologist for evaluation - Ambulatory referral to Cardiology - TSH  3. Type 2 diabetes mellitus with hyperlipidemia (HCC) No recent hemoglobin A1c for evaluation -Continue on metformin and glimepiride  - continue on Losartan  for renal protection  - Hemoglobin A1c - Lipid panel - TSH  4 Dizziness EKG as above  - Ambulatory referral to Cardiology - CBC with Differential/Platelet - COMPLETE METABOLIC PANEL WITH GFR  5. Mild intermittent asthma without complication Stable -Continue on albuterol and Advair  6. Leg cramps Continue on gabapentin  7. Benign hypertension with CKD (chronic kidney disease) stage III (HCC) Blood pressure at goal today Continue on losartan - CBC with Differential/Platelet - COMPLETE METABOLIC PANEL WITH GFR  Family/ staff Communication: Reviewed plan of care with patient verbalized understanding  Labs/tests ordered:  - Hemoglobin A1c - Lipid panel - TSH - CBC with Differential/Platelet - EKG 12-Lead - COMPLETE METABOLIC PANEL WITH GFR  Next Appointment : Return in about 6 months (around 08/27/2022) for medical mangement of chronic issues.Sandrea Hughs, NP

## 2022-02-27 LAB — COMPLETE METABOLIC PANEL WITH GFR
AG Ratio: 1.8 (calc) (ref 1.0–2.5)
ALT: 21 U/L (ref 9–46)
AST: 26 U/L (ref 10–35)
Albumin: 4.4 g/dL (ref 3.6–5.1)
Alkaline phosphatase (APISO): 53 U/L (ref 35–144)
BUN/Creatinine Ratio: 20 (calc) (ref 6–22)
BUN: 37 mg/dL — ABNORMAL HIGH (ref 7–25)
CO2: 26 mmol/L (ref 20–32)
Calcium: 9.7 mg/dL (ref 8.6–10.3)
Chloride: 105 mmol/L (ref 98–110)
Creat: 1.87 mg/dL — ABNORMAL HIGH (ref 0.70–1.28)
Globulin: 2.4 g/dL (calc) (ref 1.9–3.7)
Glucose, Bld: 148 mg/dL — ABNORMAL HIGH (ref 65–99)
Potassium: 4.9 mmol/L (ref 3.5–5.3)
Sodium: 140 mmol/L (ref 135–146)
Total Bilirubin: 0.4 mg/dL (ref 0.2–1.2)
Total Protein: 6.8 g/dL (ref 6.1–8.1)
eGFR: 37 mL/min/{1.73_m2} — ABNORMAL LOW (ref >=60)

## 2022-02-27 LAB — HEMOGLOBIN A1C
Hgb A1c MFr Bld: 6.7 % of total Hgb — ABNORMAL HIGH (ref <5.7)
Mean Plasma Glucose: 146 mg/dL
eAG (mmol/L): 8.1 mmol/L

## 2022-02-27 LAB — CBC WITH DIFFERENTIAL/PLATELET
Absolute Monocytes: 681 cells/uL (ref 200–950)
Basophils Absolute: 67 cells/uL (ref 0–200)
Basophils Relative: 0.9 %
Eosinophils Absolute: 474 cells/uL (ref 15–500)
Eosinophils Relative: 6.4 %
HCT: 30.3 % — ABNORMAL LOW (ref 38.5–50.0)
Hemoglobin: 10.1 g/dL — ABNORMAL LOW (ref 13.2–17.1)
Lymphs Abs: 1672 cells/uL (ref 850–3900)
MCH: 29 pg (ref 27.0–33.0)
MCHC: 33.3 g/dL (ref 32.0–36.0)
MCV: 87.1 fL (ref 80.0–100.0)
MPV: 11.9 fL (ref 7.5–12.5)
Monocytes Relative: 9.2 %
Neutro Abs: 4507 cells/uL (ref 1500–7800)
Neutrophils Relative %: 60.9 %
Platelets: 182 10*3/uL (ref 140–400)
RBC: 3.48 10*6/uL — ABNORMAL LOW (ref 4.20–5.80)
RDW: 12.6 % (ref 11.0–15.0)
Total Lymphocyte: 22.6 %
WBC: 7.4 10*3/uL (ref 3.8–10.8)

## 2022-02-27 LAB — TSH: TSH: 2.51 mIU/L (ref 0.40–4.50)

## 2022-02-27 LAB — LIPID PANEL
Cholesterol: 123 mg/dL (ref <200)
HDL: 32 mg/dL — ABNORMAL LOW (ref >=40)
LDL Cholesterol (Calc): 68 mg/dL (calc)
Non-HDL Cholesterol (Calc): 91 mg/dL (calc) (ref <130)
Total CHOL/HDL Ratio: 3.8 (calc) (ref <5.0)
Triglycerides: 156 mg/dL — ABNORMAL HIGH (ref <150)

## 2022-02-27 NOTE — Progress Notes (Signed)
Cardiology Office Note:    Date:  02/28/2022   ID:  WANDELL SCULLION, DOB [birth year], MRN [id number]  PCP:  Sandrea Hughs, NP   [facility] Providers Cardiologist:  Lenna Sciara, MD Referring MD: Sandrea Hughs, NP   Chief Complaint/Reason for Referral: Palpitations  ASSESSMENT:    1. Postural dizziness with presyncope   2. Type 2 diabetes mellitus without complication, without long-term current use of insulin ([place])   3. Hypertension associated with diabetes ([place])   4. Hyperlipidemia associated with type 2 diabetes mellitus (HCC)   5. Stage 3b chronic kidney disease ([place])     PLAN:    In order of problems listed above: 1.  Dizziness: We will obtain an echocardiogram and monitor to evaluate further.  We will keep follow-up open-ended depending on these results. 2.  Type 2 diabetes: Consider restarting aspirin as a challenge to see if he can tolerate this.  Continue losartan, atorvastatin, and consider Jardiance 10 mg daily 3.  Hypertension: Continue losartan.  His blood pressure is well controlled on his current regimen. 4.  Hyperlipidemia: LDL yesterday was at goal of less than 70.   5.  Chronic kidney disease: Continue losartan for renal protection.             Dispo:  Return if symptoms worsen or fail to improve.      Medication Adjustments/Labs and Tests Ordered: Current medicines are reviewed at length with the patient today.  Concerns regarding medicines are outlined above.  The following changes have been made:  no change   Labs/tests ordered: Orders Placed This Encounter  Procedures   LONG TERM MONITOR (3-14 DAYS)   EKG 12-Lead   ECHOCARDIOGRAM COMPLETE    Medication Changes: No orders of the defined types were placed in this encounter.    Current medicines are reviewed at length with the patient today.  The patient does not have concerns regarding medicines.   History of Present Illness:    FOCUSED PROBLEM LIST:   1.  Type 2 diabetes not  on insulin 2.  Hyperlipidemia 3.  Hypertension 4.  Chronic kidney disease stage III 5.  Asthma   The patient is a 76 y.o. male with the indicated medical history here for for recommendations regarding palpitations.  The patient was seen by their PCP recently.  Laboratories were unremarkable including a TSH.  He was seen by his PCP.  He reported dizziness when he would walk out in the bright sunlight.  Sitting down would help this.  He really denies any palpitations.  Denies any exertional angina, exertional dyspnea, peripheral edema, paroxysmal nocturnal dyspnea, or signs or symptoms of stroke.  He otherwise feels well and is without complaints.  In terms of other issues he tells me that he has developed GI bleeding in response to aspirin even while he was on a PPI.  He has had no recent bleeding.          Current Medications: Current Meds  Medication Sig   albuterol (PROVENTIL HFA;VENTOLIN HFA) 108 (90 BASE) MCG/ACT inhaler Inhale 1 puff into the lungs every 6 (six) hours as needed for wheezing or shortness of breath.   Apoaequorin (PREVAGEN PO) Take 1 tablet by mouth daily.   atorvastatin (LIPITOR) 20 MG tablet Take 20 mg by mouth daily.   cholecalciferol (VITAMIN D3) 25 MCG (1000 UNIT) tablet Take 1,000 Units by mouth daily.   Fluticasone-Salmeterol (ADVAIR) 500-50 MCG/DOSE AEPB Inhale 1 puff into the lungs 2 (two) times daily.  gabapentin (NEURONTIN) 300 MG capsule Take 300 mg by mouth 2 (two) times daily.   glimepiride (AMARYL) 1 MG tablet Take 1 mg by mouth daily.   glucose blood test strip as directed.   hydrochlorothiazide (HYDRODIURIL) 25 MG tablet Take 25 mg by mouth daily.   Lancets (ONETOUCH ULTRASOFT) lancets as directed.   losartan (COZAAR) 100 MG tablet Take 100 mg by mouth every evening.    metFORMIN (GLUCOPHAGE) 1000 MG tablet Take 1,000 mg by mouth 2 (two) times daily with a meal.   Multiple Vitamin (MULTIVITAMIN WITH MINERALS) TABS tablet Take 1 tablet by mouth  daily.   naproxen sodium (ALEVE) 220 MG tablet Take 220 mg by mouth 2 (two) times daily as needed (pain).   omeprazole (PRILOSEC) 20 MG capsule Take 20 mg by mouth daily before breakfast.     Allergies:    Amlodipine and Aspirin   Social History:   Social History   Tobacco Use   Smoking status: Never   Smokeless tobacco: Never  Substance Use Topics   Alcohol use: No   Drug use: No     Family Hx: Family History  Problem Relation Age of Onset   Cancer - Colon Mother    Diabetes Mother    Diabetes Father    Heart disease Father    Diabetes Sister    Diabetes Brother    Asthma Other    Leukemia Other    Cancer - Prostate Other      Review of Systems:   Please see the history of present illness.    All other systems reviewed and are negative.     EKGs/Labs/Other Test Reviewed:    EKG:  EKG performed September 2023 that I personally reviewed demonstrates sinus rhythm with left anterior fascicular block.  EKG today demonstrates sinus tachycardia with left axis deviation  Prior CV studies: None available  Other studies Reviewed: Review of the additional studies/records demonstrates: No imaging data is showing aortic atherosclerosis or coronary artery calcification  Recent Labs: 02/26/2022: ALT 21; BUN 37; Creat 1.87; Hemoglobin 10.1; Platelets 182; Potassium 4.9; Sodium 140; TSH 2.51   Recent Lipid Panel Lab Results  Component Value Date/Time   CHOL 123 02/26/2022 11:25 AM   TRIG 156 (H) 02/26/2022 11:25 AM   HDL 32 (L) 02/26/2022 11:25 AM   LDLCALC 68 02/26/2022 11:25 AM    Risk Assessment/Calculations:               Physical Exam:    VS:  BP (!) 128/48   Pulse (!) 115   Ht 5\' 8"  (1.727 m)   Wt 162 lb (73.5 kg)   SpO2 95%   BMI 24.63 kg/m    Wt Readings from Last 3 Encounters:  02/28/22 162 lb (73.5 kg)  02/26/22 165 lb 6.4 oz (75 kg)  09/15/13 167 lb (75.8 kg)    GENERAL:  No apparent distress, AOx3 HEENT:  No carotid bruits, +2 carotid  impulses, no scleral icterus CAR: Tachycardic and regular no murmurs, gallops, rubs, or thrills RES:  Clear to auscultation bilaterally ABD:  Soft, nontender, nondistended, positive bowel sounds x 4 VASC:  +2 radial pulses, +2 carotid pulses, palpable pedal pulses NEURO:  CN 2-12 grossly intact; motor and sensory grossly intact PSYCH:  No active depression or anxiety EXT:  No edema, ecchymosis, or cyanosis  Signed, Early Osmond, MD  02/28/2022 9:04 AM    [address], [place], [place]  [address] Phone: [phone]; Fax: [phone]   Note:  This document was prepared using Dragon voice recognition software and may include unintentional dictation errors.

## 2022-02-28 ENCOUNTER — Encounter: Payer: Self-pay | Admitting: Family

## 2022-02-28 ENCOUNTER — Ambulatory Visit: Payer: Medicare Other | Attending: Internal Medicine | Admitting: Internal Medicine

## 2022-02-28 ENCOUNTER — Ambulatory Visit (INDEPENDENT_AMBULATORY_CARE_PROVIDER_SITE_OTHER): Payer: Medicare Other | Admitting: Family

## 2022-02-28 ENCOUNTER — Ambulatory Visit: Payer: Medicare Other | Attending: Internal Medicine

## 2022-02-28 ENCOUNTER — Encounter: Payer: Self-pay | Admitting: Internal Medicine

## 2022-02-28 VITALS — BP 138/60 | HR 100 | Temp 98.2°F | Resp 17 | Ht 68.0 in | Wt 162.8 lb

## 2022-02-28 VITALS — BP 128/48 | HR 115 | Ht 68.0 in | Wt 162.0 lb

## 2022-02-28 DIAGNOSIS — I152 Hypertension secondary to endocrine disorders: Secondary | ICD-10-CM

## 2022-02-28 DIAGNOSIS — E119 Type 2 diabetes mellitus without complications: Secondary | ICD-10-CM

## 2022-02-28 DIAGNOSIS — R42 Dizziness and giddiness: Secondary | ICD-10-CM

## 2022-02-28 DIAGNOSIS — E1165 Type 2 diabetes mellitus with hyperglycemia: Secondary | ICD-10-CM

## 2022-02-28 DIAGNOSIS — E1159 Type 2 diabetes mellitus with other circulatory complications: Secondary | ICD-10-CM

## 2022-02-28 DIAGNOSIS — N1832 Chronic kidney disease, stage 3b: Secondary | ICD-10-CM | POA: Diagnosis not present

## 2022-02-28 DIAGNOSIS — I129 Hypertensive chronic kidney disease with stage 1 through stage 4 chronic kidney disease, or unspecified chronic kidney disease: Secondary | ICD-10-CM | POA: Diagnosis not present

## 2022-02-28 DIAGNOSIS — D649 Anemia, unspecified: Secondary | ICD-10-CM | POA: Diagnosis not present

## 2022-02-28 DIAGNOSIS — N183 Chronic kidney disease, stage 3 unspecified: Secondary | ICD-10-CM

## 2022-02-28 DIAGNOSIS — R55 Syncope and collapse: Secondary | ICD-10-CM | POA: Diagnosis not present

## 2022-02-28 DIAGNOSIS — E785 Hyperlipidemia, unspecified: Secondary | ICD-10-CM

## 2022-02-28 DIAGNOSIS — E781 Pure hyperglyceridemia: Secondary | ICD-10-CM

## 2022-02-28 DIAGNOSIS — E1169 Type 2 diabetes mellitus with other specified complication: Secondary | ICD-10-CM | POA: Diagnosis not present

## 2022-02-28 DIAGNOSIS — R002 Palpitations: Secondary | ICD-10-CM

## 2022-02-28 NOTE — Progress Notes (Signed)
Provider: Marlowe Sax FNP-C  Andree Heeg, Nelda Bucks, NP  Patient Care Team: Raekwon Winkowski, Nelda Bucks, NP as PCP - General (Family Medicine)  Extended Emergency Contact Information Primary Emergency Contact: Reppert,Renee Address: [place] [address]          [place], [place] [address] Johnnette Litter of [place] Phone: [phone] Relation: Daughter Secondary Emergency Contact: Vallone,Beverly Address: [address]          [place], [place] [address] Johnnette Litter of [place] Phone: [phone] Relation: Spouse  Code Status:  Full Code  Goals of care: Advanced Directive information    02/26/2022   10:07 AM  Advanced Directives  Does Patient Have a Medical Advance Directive? No  Would patient like information on creating a medical advance directive? No - Patient declined     Chief Complaint  Patient presents with   Acute Visit    Discuss lab results     HPI:  Pt is a 76 y.o. male seen today for an acute visit to discuss lab results.He was here 02/26/2022 to establish care with the provider.Fasting lab work done.Results reviewed and discussed with patient during visit.Labs unremarkable except for the following abnormal results.  Hemoglobin indicates normocytic anemia 10.1 no previous recent labs for comparison but had low hgb in 2015.He states has had blood in the stool in the past.He denies She denies any fatigue,chest tightness,chest pain,palpitation or shortness of breath.Has occasional dizziness with bright lights.  Not on any anticoagulant or Asprin. 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   Hgb A 1 C 6.7 and glucose 148 states blood sugars runs in the 160's-170's.currently on metformin and Aleve.  Cholesterol 123,HDL 32,TRG 156 and LDL 68 on atorvastatin.  BUN 37; CR 1.87 with GFR 37 states previously told he had CKD stage 3.  He was previously referred to cardiologist on 02/26/2022  for palpitation and dizziness.states was seen today was advised to wear heart monitor.Also scheduled for and ECHO.    Past Medical History:  Diagnosis Date   Asthma    Diabetes mellitus without complication (HCC)    GERD (gastroesophageal reflux disease)    Hx of hypercholesterolemia    PMH   Hypertension    Pneumonia    Past Surgical History:  Procedure Laterality Date   COLONOSCOPY     DECOMPRESSIVE LUMBAR LAMINECTOMY LEVEL 2 N/A 09/14/2013   Procedure: Lumbar Four-Sacral One Decompression and Transforaminal Lumbar Four-Five Interbody and Lumbar Four-Sacral One Fusion;  Surgeon: Melina Schools, MD;  Location: [facility];  Service: Orthopedics;  Laterality: N/A;   FRACTURE SURGERY     left pinky   LUNG SURGERY      Allergies  Allergen Reactions   Amlodipine     rash   Aspirin Other (See Comments)    Bleeding in GI tract    Outpatient Encounter Medications as of 02/28/2022  Medication Sig   albuterol (PROVENTIL HFA;VENTOLIN HFA) 108 (90 BASE) MCG/ACT inhaler Inhale 1 puff into the lungs every 6 (six) hours as needed for wheezing or shortness of breath.   Apoaequorin (PREVAGEN PO) Take 1 tablet by mouth daily.   atorvastatin (LIPITOR) 20 MG tablet Take 20 mg by mouth daily.   cholecalciferol (VITAMIN D3) 25 MCG (1000 UNIT) tablet Take 1,000 Units by mouth daily.   Fluticasone-Salmeterol (ADVAIR) 500-50 MCG/DOSE AEPB Inhale 1 puff into the lungs 2 (  two) times daily.    gabapentin (NEURONTIN) 300 MG capsule Take 300 mg by mouth 2 (two) times daily.   glimepiride (AMARYL) 1 MG tablet Take 1 mg by mouth daily.   glucose blood test strip as directed.   hydrochlorothiazide (HYDRODIURIL) 25 MG tablet Take 25 mg by mouth daily.   Lancets (ONETOUCH ULTRASOFT) lancets as directed.   losartan (COZAAR) 100 MG tablet Take 100 mg by mouth every evening.    metFORMIN (GLUCOPHAGE) 1000 MG tablet Take 1,000 mg by mouth 2 (two) times daily with a meal.   Multiple Vitamin (MULTIVITAMIN WITH  MINERALS) TABS tablet Take 1 tablet by mouth daily.   naproxen sodium (ALEVE) 220 MG tablet Take 220 mg by mouth 2 (two) times daily as needed (pain).   omeprazole (PRILOSEC) 20 MG capsule Take 20 mg by mouth daily before breakfast.   No facility-administered encounter medications on file as of 02/28/2022.    Review of Systems  Constitutional:  Negative for appetite change, chills, fatigue, fever and unexpected weight change.  Eyes:  Negative for pain, discharge, redness, itching and visual disturbance.  Respiratory:  Negative for cough, chest tightness, shortness of breath and wheezing.   Cardiovascular:  Positive for palpitations. Negative for chest pain and leg swelling.  Gastrointestinal:  Negative for abdominal distention, abdominal pain, blood in stool, constipation, diarrhea, nausea and vomiting.  Endocrine: Negative for cold intolerance, heat intolerance, polydipsia, polyphagia and polyuria.  Genitourinary:  Negative for difficulty urinating, dysuria, flank pain, frequency and urgency.  Musculoskeletal:  Negative for arthralgias, back pain, gait problem, joint swelling, myalgias, neck pain and neck stiffness.  Skin:  Negative for color change, pallor, rash and wound.  Neurological:  Negative for dizziness, weakness, light-headedness, numbness and headaches.  Psychiatric/Behavioral:  Negative for agitation, behavioral problems, confusion, hallucinations and sleep disturbance. The patient is not nervous/anxious.     Immunization History  Administered Date(s) Administered   Fluad Quad(high Dose 65+) 02/26/2022   Influenza, Seasonal, Injecte, Preservative Fre 03/24/2011   Influenza,inj,Quad PF,6+ Mos 03/18/2017, 02/17/2018, 02/09/2019, 04/10/2020   Influenza,inj,quad, With Preservative 03/29/2018   Influenza-Unspecified 06/03/1999, 04/17/2010, 03/19/2015, 05/02/2016, 03/19/2021   PFIZER Comirnaty(Gray Top)Covid-19 Tri-Sucrose Vaccine 09/11/2020   PFIZER(Purple Top)SARS-COV-2  Vaccination 09/22/2019, 10/17/2019, 04/18/2020   Pfizer Covid-19 Vaccine Bivalent Booster 33yr & up 03/19/2021, 01/02/2022   Pneumococcal Conjugate-13 06/03/2015   Pneumococcal Polysaccharide-23 04/17/2010, 08/05/2016   Pneumococcal-Unspecified 07/14/2016   Td (Adult),unspecified 01/07/2013   Pertinent  Health Maintenance Due  Topic Date Due   FOOT EXAM  Never done   OPHTHALMOLOGY EXAM  Never done   HEMOGLOBIN A1C  08/27/2022   INFLUENZA VACCINE  Completed      06/13/2019    9:08 PM 06/14/2019    6:01 AM 02/26/2022   10:07 AM 02/28/2022   11:03 AM  Fall Risk  Falls in the past year?   0 0  Was there an injury with Fall?   0 0  Fall Risk Category Calculator   0 0  Fall Risk Category   Low Low  Patient Fall Risk Level Low fall risk Low fall risk Low fall risk Low fall risk  Patient at Risk for Falls Due to   No Fall Risks History of fall(s)  Fall risk Follow up   Falls evaluation completed Falls evaluation completed   Functional Status Survey:    Vitals:   02/28/22 1100  BP: 138/60  Pulse: 100  Resp: 17  Temp: 98.2 F (36.8 C)  TempSrc: Temporal  SpO2: 96%  Weight: 162 lb 12.8 oz (73.8 kg)  Height: 5' 8"  (1.727 m)   Body mass index is 24.75 kg/m. Physical Exam  Labs reviewed: Recent Labs    02/26/22 1125  NA 140  K 4.9  CL 105  CO2 26  GLUCOSE 148*  BUN 37*  CREATININE 1.87*  CALCIUM 9.7   Recent Labs    02/26/22 1125  AST 26  ALT 21  BILITOT 0.4  PROT 6.8   Recent Labs    02/26/22 1125  WBC 7.4  NEUTROABS 4,507  HGB 10.1*  HCT 30.3*  MCV 87.1  PLT 182   Lab Results  Component Value Date   TSH 2.51 02/26/2022   Lab Results  Component Value Date   HGBA1C 6.7 (H) 02/26/2022   Lab Results  Component Value Date   CHOL 123 02/26/2022   HDL 32 (L) 02/26/2022   LDLCALC 68 02/26/2022   TRIG 156 (H) 02/26/2022   CHOLHDL 3.8 02/26/2022    Significant Diagnostic Results in last 30 days:  No results found.  Assessment/Plan 1. Benign  hypertension with CKD (chronic kidney disease) stage III (HCC) Blood pressure control -Continue losartan and hydrochlorothiazide - TSH; Future - COMPLETE METABOLIC PANEL WITH GFR; Future - CBC with Differential/Platelet; Future  2. Normocytic anemia Hemoglobin 10.1 reports previous history of blood in the stool but none recently.  We will have him provide stool to rule out symptoms of GI bleed. -Encouraged to increase iron [place] foods such as Limited vegetables and beets - Fecal Globin By Immunochemistry - CBC with Differential/Platelet; Future  3. Stage 3b chronic kidney disease (HCC) Creatinine 1.87 which has worsened compared to previous labs. -Advised to avoid nephrotoxins dose all other medication for renal clearance.  Follow-up within nephrologist - Ambulatory referral to Nephrology  4. Type 2 diabetes mellitus with hyperglycemia, without long-term current use of insulin (HCC) Lab Results  Component Value Date   HGBA1C 6.7 (H) 02/26/2022  Hemoglobin A1c high but appropriate for age -Continue dietary modification and exercise -Continue on metformin and glimepiride -On statin.  We will hold off on statin aspirin due to to previous blood in the stool. - Lipid panel; Future - TSH; Future - Hemoglobin A1c; Future  5. Hypertriglyceridemia TRG 156 Dietary modification and exercise advised -Continue to monitor - Lipid panel; Future   Family/ staff Communication: Reviewed plan of care with patient verbalized understanding  Labs/tests ordered:  - CBC with Differential/Platelet - CMP with eGFR([facility]) - TSH - Hgb A1C - Lipid panel   Next Appointment: Return in about 6 months (around 08/25/2022) for fasting labs prior to visit.   Sandrea Hughs, NP

## 2022-02-28 NOTE — Patient Instructions (Signed)
Medication Instructions:  No changes *If you need a refill on your cardiac medications before your next appointment, please call your pharmacy*   Lab Work: none If you have labs (blood work) drawn today and your tests are completely normal, you will receive your results only by: [place] (if you have MyChart) OR A paper copy in the mail If you have any lab test that is abnormal or we need to change your treatment, we will call you to review the results.   Testing/Procedures: Your physician has requested that you have an echocardiogram. Echocardiography is a painless test that uses sound waves to create images of your heart. It provides your doctor with information about the size and shape of your heart and how well your heart's chambers and valves are working. This procedure takes approximately one hour. There are no restrictions for this procedure.  3 day Zio monitor - see instructions below   Follow-Up: As needed  Important Information About Sugar

## 2022-02-28 NOTE — Progress Notes (Unsigned)
ZIO XT mailed to pt's home address. 

## 2022-03-04 ENCOUNTER — Encounter: Payer: Self-pay | Admitting: Family

## 2022-03-04 DIAGNOSIS — I152 Hypertension secondary to endocrine disorders: Secondary | ICD-10-CM | POA: Diagnosis not present

## 2022-03-04 DIAGNOSIS — R55 Syncope and collapse: Secondary | ICD-10-CM

## 2022-03-04 DIAGNOSIS — R42 Dizziness and giddiness: Secondary | ICD-10-CM

## 2022-03-04 DIAGNOSIS — E1159 Type 2 diabetes mellitus with other circulatory complications: Secondary | ICD-10-CM

## 2022-03-05 DIAGNOSIS — D649 Anemia, unspecified: Secondary | ICD-10-CM | POA: Diagnosis not present

## 2022-03-06 LAB — FECAL GLOBIN BY IMMUNOCHEMISTRY
FECAL GLOBIN RESULT:: NOT DETECTED
MICRO NUMBER:: 14006898
SPECIMEN QUALITY:: ADEQUATE

## 2022-03-12 DIAGNOSIS — R42 Dizziness and giddiness: Secondary | ICD-10-CM | POA: Diagnosis not present

## 2022-03-12 DIAGNOSIS — R55 Syncope and collapse: Secondary | ICD-10-CM | POA: Diagnosis not present

## 2022-03-17 ENCOUNTER — Ambulatory Visit (HOSPITAL_COMMUNITY): Payer: Medicare Other | Attending: Internal Medicine

## 2022-03-17 DIAGNOSIS — R55 Syncope and collapse: Secondary | ICD-10-CM | POA: Diagnosis not present

## 2022-03-17 DIAGNOSIS — E1159 Type 2 diabetes mellitus with other circulatory complications: Secondary | ICD-10-CM | POA: Diagnosis not present

## 2022-03-17 DIAGNOSIS — I152 Hypertension secondary to endocrine disorders: Secondary | ICD-10-CM | POA: Diagnosis not present

## 2022-03-17 DIAGNOSIS — R42 Dizziness and giddiness: Secondary | ICD-10-CM | POA: Diagnosis not present

## 2022-03-17 LAB — ECHOCARDIOGRAM COMPLETE
Area-P 1/2: 2.91 cm2
S' Lateral: 2.8 cm

## 2022-03-20 DIAGNOSIS — Q828 Other specified congenital malformations of skin: Secondary | ICD-10-CM | POA: Diagnosis not present

## 2022-03-20 DIAGNOSIS — D2272 Melanocytic nevi of left lower limb, including hip: Secondary | ICD-10-CM | POA: Diagnosis not present

## 2022-03-20 DIAGNOSIS — D2262 Melanocytic nevi of left upper limb, including shoulder: Secondary | ICD-10-CM | POA: Diagnosis not present

## 2022-03-20 DIAGNOSIS — D2261 Melanocytic nevi of right upper limb, including shoulder: Secondary | ICD-10-CM | POA: Diagnosis not present

## 2022-03-20 DIAGNOSIS — Z85828 Personal history of other malignant neoplasm of skin: Secondary | ICD-10-CM | POA: Diagnosis not present

## 2022-04-11 ENCOUNTER — Other Ambulatory Visit: Payer: Self-pay | Admitting: Adult Health

## 2022-04-11 ENCOUNTER — Telehealth: Payer: Self-pay

## 2022-04-11 DIAGNOSIS — R059 Cough, unspecified: Secondary | ICD-10-CM

## 2022-04-11 MED ORDER — BENZONATATE 100 MG PO CAPS: 100.0000 mg | ORAL_CAPSULE | Freq: Two times a day (BID) | ORAL | 0 refills | Status: DC | PRN

## 2022-04-11 NOTE — Telephone Encounter (Signed)
Patient called and states that he has been fighting bad cold for about a week. Patient has sinus drainage and coughing. Patient has no fever.Patient eould like something for cough.Patient has no congestion. He staes that he thinks he has gotten the sinus drainage under scontrol he just can't get rid of the cough.  Message routed to Kenard Gower, NP

## 2022-04-15 DIAGNOSIS — J4531 Mild persistent asthma with (acute) exacerbation: Secondary | ICD-10-CM | POA: Diagnosis not present

## 2022-04-15 DIAGNOSIS — R5383 Other fatigue: Secondary | ICD-10-CM | POA: Diagnosis not present

## 2022-04-15 DIAGNOSIS — Z8616 Personal history of COVID-19: Secondary | ICD-10-CM | POA: Diagnosis not present

## 2022-04-15 DIAGNOSIS — R051 Acute cough: Secondary | ICD-10-CM | POA: Diagnosis not present

## 2022-04-29 DIAGNOSIS — J4531 Mild persistent asthma with (acute) exacerbation: Secondary | ICD-10-CM | POA: Diagnosis not present

## 2022-04-29 DIAGNOSIS — R051 Acute cough: Secondary | ICD-10-CM | POA: Diagnosis not present

## 2022-04-29 DIAGNOSIS — Z8616 Personal history of COVID-19: Secondary | ICD-10-CM | POA: Diagnosis not present

## 2022-04-29 DIAGNOSIS — R5383 Other fatigue: Secondary | ICD-10-CM | POA: Diagnosis not present

## 2022-05-06 DIAGNOSIS — E1122 Type 2 diabetes mellitus with diabetic chronic kidney disease: Secondary | ICD-10-CM | POA: Diagnosis not present

## 2022-05-06 DIAGNOSIS — I129 Hypertensive chronic kidney disease with stage 1 through stage 4 chronic kidney disease, or unspecified chronic kidney disease: Secondary | ICD-10-CM | POA: Diagnosis not present

## 2022-05-06 DIAGNOSIS — N1832 Chronic kidney disease, stage 3b: Secondary | ICD-10-CM | POA: Diagnosis not present

## 2022-05-06 DIAGNOSIS — E7800 Pure hypercholesterolemia, unspecified: Secondary | ICD-10-CM | POA: Diagnosis not present

## 2022-05-07 ENCOUNTER — Other Ambulatory Visit: Payer: Self-pay | Admitting: Internal Medicine

## 2022-05-07 DIAGNOSIS — N1832 Chronic kidney disease, stage 3b: Secondary | ICD-10-CM

## 2022-05-07 DIAGNOSIS — E1322 Other specified diabetes mellitus with diabetic chronic kidney disease: Secondary | ICD-10-CM

## 2022-05-07 DIAGNOSIS — I129 Hypertensive chronic kidney disease with stage 1 through stage 4 chronic kidney disease, or unspecified chronic kidney disease: Secondary | ICD-10-CM

## 2022-05-14 ENCOUNTER — Telehealth: Payer: Self-pay

## 2022-05-14 NOTE — Telephone Encounter (Signed)
Noted  

## 2022-05-14 NOTE — Telephone Encounter (Signed)
Patient labs scanned into chart. Please review. They can be found under the media tab.

## 2022-05-19 ENCOUNTER — Ambulatory Visit
Admission: RE | Admit: 2022-05-19 | Discharge: 2022-05-19 | Disposition: A | Discharge status: Home / Self Care | Payer: Medicare Other | Source: Ambulatory Visit | Attending: Internal Medicine | Admitting: Internal Medicine

## 2022-05-19 DIAGNOSIS — E1322 Other specified diabetes mellitus with diabetic chronic kidney disease: Secondary | ICD-10-CM

## 2022-05-19 DIAGNOSIS — N186 End stage renal disease: Secondary | ICD-10-CM | POA: Diagnosis not present

## 2022-05-19 DIAGNOSIS — N183 Chronic kidney disease, stage 3 unspecified: Secondary | ICD-10-CM

## 2022-05-19 DIAGNOSIS — I129 Hypertensive chronic kidney disease with stage 1 through stage 4 chronic kidney disease, or unspecified chronic kidney disease: Secondary | ICD-10-CM

## 2022-05-19 DIAGNOSIS — N1832 Chronic kidney disease, stage 3b: Secondary | ICD-10-CM

## 2022-05-28 ENCOUNTER — Ambulatory Visit: Payer: Self-pay | Admitting: Family

## 2022-05-28 DIAGNOSIS — N1832 Chronic kidney disease, stage 3b: Secondary | ICD-10-CM | POA: Diagnosis not present

## 2022-08-05 DIAGNOSIS — E1122 Type 2 diabetes mellitus with diabetic chronic kidney disease: Secondary | ICD-10-CM | POA: Diagnosis not present

## 2022-08-05 DIAGNOSIS — N1832 Chronic kidney disease, stage 3b: Secondary | ICD-10-CM | POA: Diagnosis not present

## 2022-08-05 DIAGNOSIS — E7800 Pure hypercholesterolemia, unspecified: Secondary | ICD-10-CM | POA: Diagnosis not present

## 2022-08-05 DIAGNOSIS — I129 Hypertensive chronic kidney disease with stage 1 through stage 4 chronic kidney disease, or unspecified chronic kidney disease: Secondary | ICD-10-CM | POA: Diagnosis not present

## 2022-08-22 ENCOUNTER — Ambulatory Visit: Payer: Medicare Other | Admitting: Family

## 2022-08-22 ENCOUNTER — Other Ambulatory Visit: Payer: Medicare Other

## 2022-08-22 DIAGNOSIS — D649 Anemia, unspecified: Secondary | ICD-10-CM

## 2022-08-22 DIAGNOSIS — I129 Hypertensive chronic kidney disease with stage 1 through stage 4 chronic kidney disease, or unspecified chronic kidney disease: Secondary | ICD-10-CM

## 2022-08-22 DIAGNOSIS — E781 Pure hyperglyceridemia: Secondary | ICD-10-CM

## 2022-08-22 DIAGNOSIS — E1165 Type 2 diabetes mellitus with hyperglycemia: Secondary | ICD-10-CM

## 2022-08-23 LAB — LIPID PANEL
Cholesterol: 109 mg/dL (ref <200)
HDL: 37 mg/dL — ABNORMAL LOW (ref >=40)
LDL Cholesterol (Calc): 50 mg/dL (calc)
Non-HDL Cholesterol (Calc): 72 mg/dL (calc) (ref <130)
Total CHOL/HDL Ratio: 2.9 (calc) (ref <5.0)
Triglycerides: 140 mg/dL (ref <150)

## 2022-08-23 LAB — HEMOGLOBIN A1C
Hgb A1c MFr Bld: 6.8 % of total Hgb — ABNORMAL HIGH (ref <5.7)
Mean Plasma Glucose: 148 mg/dL
eAG (mmol/L): 8.2 mmol/L

## 2022-08-23 LAB — CBC WITH DIFFERENTIAL/PLATELET
Absolute Monocytes: 593 cells/uL (ref 200–950)
Basophils Absolute: 100 cells/uL (ref 0–200)
Basophils Relative: 1.3 %
Eosinophils Absolute: 293 cells/uL (ref 15–500)
Eosinophils Relative: 3.8 %
HCT: 30.4 % — ABNORMAL LOW (ref 38.5–50.0)
Hemoglobin: 10.1 g/dL — ABNORMAL LOW (ref 13.2–17.1)
Lymphs Abs: 2649 cells/uL (ref 850–3900)
MCH: 30.1 pg (ref 27.0–33.0)
MCHC: 33.2 g/dL (ref 32.0–36.0)
MCV: 90.7 fL (ref 80.0–100.0)
MPV: 12.1 fL (ref 7.5–12.5)
Monocytes Relative: 7.7 %
Neutro Abs: 4066 cells/uL (ref 1500–7800)
Neutrophils Relative %: 52.8 %
Platelets: 198 10*3/uL (ref 140–400)
RBC: 3.35 10*6/uL — ABNORMAL LOW (ref 4.20–5.80)
RDW: 11.9 % (ref 11.0–15.0)
Total Lymphocyte: 34.4 %
WBC: 7.7 10*3/uL (ref 3.8–10.8)

## 2022-08-23 LAB — COMPLETE METABOLIC PANEL WITH GFR
AG Ratio: 1.7 (calc) (ref 1.0–2.5)
ALT: 17 U/L (ref 9–46)
AST: 22 U/L (ref 10–35)
Albumin: 4.1 g/dL (ref 3.6–5.1)
Alkaline phosphatase (APISO): 50 U/L (ref 35–144)
BUN/Creatinine Ratio: 20 (calc) (ref 6–22)
BUN: 40 mg/dL — ABNORMAL HIGH (ref 7–25)
CO2: 24 mmol/L (ref 20–32)
Calcium: 9.3 mg/dL (ref 8.6–10.3)
Chloride: 106 mmol/L (ref 98–110)
Creat: 2.05 mg/dL — ABNORMAL HIGH (ref 0.70–1.28)
Globulin: 2.4 g/dL (calc) (ref 1.9–3.7)
Glucose, Bld: 150 mg/dL — ABNORMAL HIGH (ref 65–99)
Potassium: 4.4 mmol/L (ref 3.5–5.3)
Sodium: 138 mmol/L (ref 135–146)
Total Bilirubin: 0.3 mg/dL (ref 0.2–1.2)
Total Protein: 6.5 g/dL (ref 6.1–8.1)
eGFR: 33 mL/min/{1.73_m2} — ABNORMAL LOW (ref >=60)

## 2022-08-23 LAB — TSH: TSH: 3.17 mIU/L (ref 0.40–4.50)

## 2022-08-27 ENCOUNTER — Ambulatory Visit: Payer: Medicare Other | Admitting: Family

## 2022-09-03 ENCOUNTER — Encounter: Payer: Self-pay | Admitting: Family

## 2022-09-03 ENCOUNTER — Ambulatory Visit (INDEPENDENT_AMBULATORY_CARE_PROVIDER_SITE_OTHER): Payer: Medicare Other | Admitting: Family

## 2022-09-03 VITALS — BP 128/58 | HR 97 | Temp 97.7°F | Resp 17 | Ht 68.0 in | Wt 143.3 lb

## 2022-09-03 DIAGNOSIS — I129 Hypertensive chronic kidney disease with stage 1 through stage 4 chronic kidney disease, or unspecified chronic kidney disease: Secondary | ICD-10-CM

## 2022-09-03 DIAGNOSIS — N1832 Chronic kidney disease, stage 3b: Secondary | ICD-10-CM

## 2022-09-03 DIAGNOSIS — E1140 Type 2 diabetes mellitus with diabetic neuropathy, unspecified: Secondary | ICD-10-CM | POA: Diagnosis not present

## 2022-09-03 DIAGNOSIS — N183 Chronic kidney disease, stage 3 unspecified: Secondary | ICD-10-CM

## 2022-09-03 DIAGNOSIS — J4520 Mild intermittent asthma, uncomplicated: Secondary | ICD-10-CM | POA: Diagnosis not present

## 2022-09-03 DIAGNOSIS — E1165 Type 2 diabetes mellitus with hyperglycemia: Secondary | ICD-10-CM | POA: Diagnosis not present

## 2022-09-03 DIAGNOSIS — Z1159 Encounter for screening for other viral diseases: Secondary | ICD-10-CM

## 2022-09-03 MED ORDER — METFORMIN HCL 1000 MG PO TABS: 500.0000 mg | ORAL_TABLET | Freq: Two times a day (BID) | ORAL | 1 refills | Status: DC

## 2022-09-03 NOTE — Progress Notes (Signed)
Provider: Richarda Blade FNP-C   Jaycelyn Orrison, Donalee Citrin, NP  Patient Care Team: Lorean Ekstrand, Donalee Citrin, NP as PCP - General (Family Medicine)  Extended Emergency Contact Information Primary Emergency Contact: Kubicki,Renee Address: [address]           [place], [place] [address]          [place], [place] [address] Darden Amber of [place] Phone: [phone] Relation: Daughter Secondary Emergency Contact: Stieber,Beverly Address: [address]          [place], [place] [address] Darden Amber of [place] Mobile Phone: [phone] Relation: Spouse  Code Status:  Full Code  Goals of care: Advanced Directive information    09/03/2022    8:46 AM  Advanced Directives  Type of Estate agent of [place];Living will  Copy of Healthcare Power of Attorney in Chart? No - copy requested     Chief Complaint  Patient presents with   Medical Management of Chronic Issues    Patient states he is here for a follow up. Patient has no other concerns   Quality Metric Gaps    Patient is due for diabetic kidney evaluation, foot and eye exam, AWV   Immunizations    Patient is due for shingles vaccine and tdap     HPI:  Pt is a 77 y.o. male seen today for 6 months follow up for medical management of chronic diseases.  Recent lab results reviewed and discussed during the visit. Hgb A1C 6.8 glucose 150 previous 6.7 states blood sugars at home runs in the 150's.Glimepiride discontinued by Nephrologist. States has upcoming appointment with ophthalmology.He cuts own toenails.Reports numbness on the toes though has not been taking his Gabapentin.   Hgb 10.1 previous 10,1 with normal indices.  CR 2.05 BUN 40,GFR 33 follow up with Nephrology  Has pain on the knees and shoulders but states has not required Aleve for several months.  Walks 1/2 - 1 mile daily. Due for for Tdap and shingles vaccine.states has it on his to do list.will get it at the pharmacy.   Past Medical History:   Diagnosis Date   Asthma    Diabetes mellitus without complication    GERD (gastroesophageal reflux disease)    High cholesterol    History of colonoscopy    Hx of hypercholesterolemia    PMH   Hypertension    Leg pain    Pneumonia    Past Surgical History:  Procedure Laterality Date   COLONOSCOPY     DECOMPRESSIVE LUMBAR LAMINECTOMY LEVEL 2 N/A 09/14/2013   Procedure: Lumbar Four-Sacral One Decompression and Transforaminal Lumbar Four-Five Interbody and Lumbar Four-Sacral One Fusion;  Surgeon: Venita Lick, MD;  Location: [facility];  Service: Orthopedics;  Laterality: N/A;   FRACTURE SURGERY     left pinky   LUNG SURGERY      Allergies  Allergen Reactions   Amlodipine     rash   Aspirin Other (See Comments)    Bleeding in GI tract    Allergies as of 09/03/2022       Reactions   Amlodipine    rash   Aspirin Other (See Comments)   Bleeding in GI tract        Medication List        Accurate as of September 03, 2022  9:27 AM. If you have any questions, ask your nurse or doctor.          STOP taking these medications    benzonatate 100 MG  capsule Commonly known as: TESSALON Stopped by: Caesar Bookman, NP   glimepiride 1 MG tablet Commonly known as: AMARYL Stopped by: Caesar Bookman, NP       TAKE these medications    albuterol 108 (90 Base) MCG/ACT inhaler Commonly known as: VENTOLIN HFA Inhale 1 puff into the lungs every 6 (six) hours as needed for wheezing or shortness of breath.   atorvastatin 20 MG tablet Commonly known as: LIPITOR Take 20 mg by mouth daily.   cholecalciferol 25 MCG (1000 UNIT) tablet Commonly known as: VITAMIN D3 Take 1,000 Units by mouth daily.   Farxiga 10 MG Tabs tablet Generic drug: dapagliflozin propanediol Take 10 mg by mouth daily.   Fluticasone-Salmeterol 500-50 MCG/DOSE Aepb Commonly known as: ADVAIR Inhale 1 puff into the lungs 2 (two) times daily.   Wixela Inhub 500-50 MCG/ACT Aepb Generic drug:  fluticasone-salmeterol Inhale 1 puff into the lungs in the morning and at bedtime.   gabapentin 300 MG capsule Commonly known as: NEURONTIN Take 300 mg by mouth 2 (two) times daily.   glucose blood test strip as directed.   hydrochlorothiazide 25 MG tablet Commonly known as: HYDRODIURIL Take 25 mg by mouth daily.   losartan 100 MG tablet Commonly known as: COZAAR Take 100 mg by mouth every evening.   metFORMIN 1000 MG tablet Commonly known as: GLUCOPHAGE Take 0.5 tablets (500 mg total) by mouth 2 (two) times daily with a meal.   multivitamin with minerals Tabs tablet Take 1 tablet by mouth daily.   naproxen sodium 220 MG tablet Commonly known as: ALEVE Take 220 mg by mouth 2 (two) times daily as needed (pain).   omeprazole 20 MG capsule Commonly known as: PRILOSEC Take 20 mg by mouth daily before breakfast.   onetouch ultrasoft lancets as directed.   PREVAGEN PO Take 1 tablet by mouth daily.        Review of Systems  Constitutional:  Negative for appetite change, chills, fatigue, fever and unexpected weight change.  HENT:  Negative for congestion, dental problem, ear discharge, ear pain, facial swelling, hearing loss, nosebleeds, postnasal drip, rhinorrhea, sinus pressure, sinus pain, sneezing, sore throat, tinnitus and trouble swallowing.   Eyes:  Negative for pain, discharge, redness, itching and visual disturbance.  Respiratory:  Negative for cough, chest tightness, shortness of breath and wheezing.   Cardiovascular:  Negative for chest pain, palpitations and leg swelling.  Gastrointestinal:  Negative for abdominal distention, abdominal pain, blood in stool, constipation, diarrhea, nausea and vomiting.  Endocrine: Negative for cold intolerance, heat intolerance, polydipsia, polyphagia and polyuria.  Genitourinary:  Negative for difficulty urinating, dysuria, flank pain, frequency and urgency.  Musculoskeletal:  Negative for arthralgias, back pain, gait problem,  joint swelling, myalgias, neck pain and neck stiffness.  Skin:  Negative for color change, pallor, rash and wound.  Neurological:  Negative for dizziness, syncope, speech difficulty, weakness, light-headedness, numbness and headaches.  Hematological:  Does not bruise/bleed easily.  Psychiatric/Behavioral:  Negative for agitation, behavioral problems, confusion, hallucinations, self-injury, sleep disturbance and suicidal ideas. The patient is not nervous/anxious.     Immunization History  Administered Date(s) Administered   Covid-19, Mrna,Vaccine(Spikevax)35yrs and older 08/22/2022   Fluad Quad(high Dose 65+) 02/26/2022   Influenza, Seasonal, Injecte, Preservative Fre 03/24/2011   Influenza,inj,Quad PF,6+ Mos 03/18/2017, 02/17/2018, 02/09/2019, 04/10/2020   Influenza,inj,quad, With Preservative 03/29/2018   Influenza-Unspecified 06/03/1999, 04/17/2010, 03/19/2015, 05/02/2016, 03/19/2021   PFIZER Comirnaty(Gray Top)Covid-19 Tri-Sucrose Vaccine 09/11/2020   PFIZER(Purple Top)SARS-COV-2 Vaccination 09/22/2019, 10/17/2019, 04/18/2020   Pfizer  Covid-19 Vaccine Bivalent Booster 6082yrs & up 03/19/2021, 01/02/2022   Pneumococcal Conjugate-13 06/03/2015   Pneumococcal Polysaccharide-23 04/17/2010, 08/05/2016   Pneumococcal-Unspecified 07/14/2016   Td (Adult),unspecified 01/07/2013   Pertinent  Health Maintenance Due  Topic Date Due   FOOT EXAM  Never done   OPHTHALMOLOGY EXAM  Never done   INFLUENZA VACCINE  01/01/2023   HEMOGLOBIN A1C  02/22/2023      06/13/2019    9:08 PM 06/14/2019    6:01 AM 02/26/2022   10:07 AM 02/28/2022   11:03 AM 09/03/2022    8:46 AM  Fall Risk  Falls in the past year?   0 0 0  Was there an injury with Fall?   0 0 0  Fall Risk Category Calculator   0 0 0  Fall Risk Category (Retired)   Low Low   (RETIRED) Patient Fall Risk Level Low fall risk Low fall risk Low fall risk Low fall risk   Patient at Risk for Falls Due to   No Fall Risks History of fall(s)   Fall  risk Follow up   Falls evaluation completed Falls evaluation completed    Functional Status Survey:    Vitals:   09/03/22 0836  BP: (!) 128/58  Pulse: 97  Resp: 17  Temp: 97.7 F (36.5 C)  TempSrc: Temporal  SpO2: 93%  Weight: 143 lb 4.8 oz (65 kg)  Height: 5\' 8"  (1.727 m)   Body mass index is 21.79 kg/m. Physical Exam Vitals reviewed.  Constitutional:      General: He is not in acute distress.    Appearance: Normal appearance. He is normal weight. He is not ill-appearing or diaphoretic.  HENT:     Head: Normocephalic.     Right Ear: Tympanic membrane, ear canal and external ear normal. There is no impacted cerumen.     Left Ear: Tympanic membrane, ear canal and external ear normal. There is no impacted cerumen.     Nose: Nose normal. No congestion or rhinorrhea.     Mouth/Throat:     Mouth: Mucous membranes are moist.     Pharynx: Oropharynx is clear. No oropharyngeal exudate or posterior oropharyngeal erythema.  Eyes:     General: No scleral icterus.       Right eye: No discharge.        Left eye: No discharge.     Extraocular Movements: Extraocular movements intact.     Conjunctiva/sclera: Conjunctivae normal.     Pupils: Pupils are equal, round, and reactive to light.  Neck:     Vascular: No carotid bruit.  Cardiovascular:     Rate and Rhythm: Normal rate and regular rhythm.     Pulses: Normal pulses.     Heart sounds: Normal heart sounds. No murmur heard.    No friction rub. No gallop.  Pulmonary:     Effort: Pulmonary effort is normal. No respiratory distress.     Breath sounds: Normal breath sounds. No wheezing, rhonchi or rales.  Chest:     Chest wall: No tenderness.  Abdominal:     General: Bowel sounds are normal. There is no distension.     Palpations: Abdomen is soft. There is no mass.     Tenderness: There is no abdominal tenderness. There is no right CVA tenderness, left CVA tenderness, guarding or rebound.  Musculoskeletal:        General: No  swelling or tenderness. Normal range of motion.     Cervical back: Normal range of motion.  No rigidity or tenderness.     Right lower leg: No edema.     Left lower leg: No edema.  Lymphadenopathy:     Cervical: No cervical adenopathy.  Skin:    General: Skin is warm and dry.     Coloration: Skin is not pale.     Findings: No bruising, erythema, lesion or rash.  Neurological:     Mental Status: He is alert and oriented to person, place, and time.     Cranial Nerves: No cranial nerve deficit.     Sensory: No sensory deficit.     Motor: No weakness.     Coordination: Coordination normal.     Gait: Gait normal.  Psychiatric:        Mood and Affect: Mood normal.        Speech: Speech normal.        Behavior: Behavior normal.        Thought Content: Thought content normal.        Judgment: Judgment normal.     Labs reviewed: Recent Labs    02/26/22 1125 08/22/22 0803  NA 140 138  K 4.9 4.4  CL 105 106  CO2 26 24  GLUCOSE 148* 150*  BUN 37* 40*  CREATININE 1.87* 2.05*  CALCIUM 9.7 9.3   Recent Labs    02/26/22 1125 08/22/22 0803  AST 26 22  ALT 21 17  BILITOT 0.4 0.3  PROT 6.8 6.5   Recent Labs    02/26/22 1125 08/22/22 0803  WBC 7.4 7.7  NEUTROABS 4,507 4,066  HGB 10.1* 10.1*  HCT 30.3* 30.4*  MCV 87.1 90.7  PLT 182 198   Lab Results  Component Value Date   TSH 3.17 08/22/2022   Lab Results  Component Value Date   HGBA1C 6.8 (H) 08/22/2022   Lab Results  Component Value Date   CHOL 109 08/22/2022   HDL 37 (L) 08/22/2022   LDLCALC 50 08/22/2022   TRIG 140 08/22/2022   CHOLHDL 2.9 08/22/2022    Significant Diagnostic Results in last 30 days:  No results found.  Assessment/Plan 1. Controlled type 2 diabetes with neuropathy Lab Results  Component Value Date   HGBA1C 6.8 (H) 08/22/2022  Continue on metformin  - continue with dietary modification and walking exercises - Follow up with ophthalmologist as scheduled. - metFORMIN (GLUCOPHAGE)  1000 MG tablet; Take 0.5 tablets (500 mg total) by mouth 2 (two) times daily with a meal.  Dispense: 90 tablet; Refill: 1 - Microalbumin / creatinine urine ratio - Lipid panel; Future - TSH; Future - COMPLETE METABOLIC PANEL WITH GFR; Future - CBC with Differential/Platelet; Future - Hemoglobin A1c; Future  2. Stage 3b chronic kidney disease CR 2.05 stable  - COMPLETE METABOLIC PANEL WITH GFR; Future  3. Benign hypertension with CKD (chronic kidney disease) stage III B/p well controlled  Continue on losartan and hydrochlorothiazide  4. Mild intermittent asthma without complication Stable Continue on albuterol and Advair  5. Encounter for hepatitis C screening test for low risk patient Low risk - Hep C Antibody; Future  Family/ staff Communication: Reviewed plan of care with patient verbalized understanding  Labs/tests ordered:  - Microalbumin / creatinine urine ratio - Lipid panel; Future - TSH; Future - COMPLETE METABOLIC PANEL WITH GFR; Future - CBC with Differential/Platelet; Future - Hemoglobin A1c; Future - Hep C Antibody; Future  Next Appointment : Return in about 6 months (around 03/05/2023) for medical mangement of chronic issues., Fasting labs in 6 months  prior to visit.Annual wellness visit .   Caesar Bookmaninah C Traylon Schimming, NP

## 2022-09-04 LAB — MICROALBUMIN / CREATININE URINE RATIO
Creatinine, Urine: 74 mg/dL (ref 20–320)
Microalb Creat Ratio: 50 mg/g creat — ABNORMAL HIGH (ref <30)
Microalb, Ur: 3.7 mg/dL

## 2022-09-17 DIAGNOSIS — H43811 Vitreous degeneration, right eye: Secondary | ICD-10-CM | POA: Diagnosis not present

## 2022-09-17 DIAGNOSIS — E119 Type 2 diabetes mellitus without complications: Secondary | ICD-10-CM | POA: Diagnosis not present

## 2022-09-17 DIAGNOSIS — H40013 Open angle with borderline findings, low risk, bilateral: Secondary | ICD-10-CM | POA: Diagnosis not present

## 2022-09-17 DIAGNOSIS — H26493 Other secondary cataract, bilateral: Secondary | ICD-10-CM | POA: Diagnosis not present

## 2022-10-01 DIAGNOSIS — K080 Exfoliation of teeth due to systemic causes: Secondary | ICD-10-CM | POA: Diagnosis not present

## 2022-10-06 DIAGNOSIS — E1165 Type 2 diabetes mellitus with hyperglycemia: Secondary | ICD-10-CM | POA: Diagnosis not present

## 2022-10-06 DIAGNOSIS — R634 Abnormal weight loss: Secondary | ICD-10-CM | POA: Diagnosis not present

## 2022-10-06 DIAGNOSIS — Z125 Encounter for screening for malignant neoplasm of prostate: Secondary | ICD-10-CM | POA: Diagnosis not present

## 2022-10-06 DIAGNOSIS — D649 Anemia, unspecified: Secondary | ICD-10-CM | POA: Diagnosis not present

## 2022-10-06 DIAGNOSIS — I10 Essential (primary) hypertension: Secondary | ICD-10-CM | POA: Diagnosis not present

## 2022-10-06 DIAGNOSIS — K760 Fatty (change of) liver, not elsewhere classified: Secondary | ICD-10-CM | POA: Diagnosis not present

## 2022-10-06 DIAGNOSIS — E7800 Pure hypercholesterolemia, unspecified: Secondary | ICD-10-CM | POA: Diagnosis not present

## 2023-02-05 DIAGNOSIS — E1122 Type 2 diabetes mellitus with diabetic chronic kidney disease: Secondary | ICD-10-CM | POA: Diagnosis not present

## 2023-02-05 DIAGNOSIS — N1832 Chronic kidney disease, stage 3b: Secondary | ICD-10-CM | POA: Diagnosis not present

## 2023-02-05 DIAGNOSIS — E7800 Pure hypercholesterolemia, unspecified: Secondary | ICD-10-CM | POA: Diagnosis not present

## 2023-02-05 DIAGNOSIS — I129 Hypertensive chronic kidney disease with stage 1 through stage 4 chronic kidney disease, or unspecified chronic kidney disease: Secondary | ICD-10-CM | POA: Diagnosis not present

## 2023-02-05 LAB — COMPREHENSIVE METABOLIC PANEL
Albumin: 4.6 (ref 3.5–5.0)
Calcium: 9.4 (ref 8.7–10.7)

## 2023-02-05 LAB — PROTEIN / CREATININE RATIO, URINE
Albumin, U: 88.9
Creatinine, Urine: 81.9

## 2023-02-05 LAB — BASIC METABOLIC PANEL
BUN: 49 — AB (ref 4–21)
CO2: 21 (ref 13–22)
Chloride: 106 (ref 99–108)
Creatinine: 2.2 — AB (ref 0.6–1.3)
Glucose: 131
Potassium: 5.3 meq/L — AB (ref 3.5–5.1)
Sodium: 137 (ref 137–147)

## 2023-02-05 LAB — MICROALBUMIN / CREATININE URINE RATIO: Microalb Creat Ratio: 109

## 2023-02-05 LAB — CBC AND DIFFERENTIAL: Hemoglobin: 9 — AB (ref 13.5–17.5)

## 2023-02-17 DIAGNOSIS — N1832 Chronic kidney disease, stage 3b: Secondary | ICD-10-CM | POA: Diagnosis not present

## 2023-03-02 ENCOUNTER — Other Ambulatory Visit: Payer: Medicare Other

## 2023-03-02 DIAGNOSIS — N1832 Chronic kidney disease, stage 3b: Secondary | ICD-10-CM

## 2023-03-02 DIAGNOSIS — E1140 Type 2 diabetes mellitus with diabetic neuropathy, unspecified: Secondary | ICD-10-CM

## 2023-03-02 DIAGNOSIS — Z1159 Encounter for screening for other viral diseases: Secondary | ICD-10-CM | POA: Diagnosis not present

## 2023-03-03 LAB — CBC WITH DIFFERENTIAL/PLATELET
Absolute Monocytes: 702 {cells}/uL (ref 200–950)
Basophils Absolute: 94 {cells}/uL (ref 0–200)
Basophils Relative: 1.2 %
Eosinophils Absolute: 320 {cells}/uL (ref 15–500)
Eosinophils Relative: 4.1 %
HCT: 30.6 % — ABNORMAL LOW (ref 38.5–50.0)
Hemoglobin: 10 g/dL — ABNORMAL LOW (ref 13.2–17.1)
Lymphs Abs: 2005 {cells}/uL (ref 850–3900)
MCH: 29.6 pg (ref 27.0–33.0)
MCHC: 32.7 g/dL (ref 32.0–36.0)
MCV: 90.5 fL (ref 80.0–100.0)
MPV: 12.2 fL (ref 7.5–12.5)
Monocytes Relative: 9 %
Neutro Abs: 4680 {cells}/uL (ref 1500–7800)
Neutrophils Relative %: 60 %
Platelets: 194 10*3/uL (ref 140–400)
RBC: 3.38 10*6/uL — ABNORMAL LOW (ref 4.20–5.80)
RDW: 12.3 % (ref 11.0–15.0)
Total Lymphocyte: 25.7 %
WBC: 7.8 10*3/uL (ref 3.8–10.8)

## 2023-03-03 LAB — TSH: TSH: 2.22 m[IU]/L (ref 0.40–4.50)

## 2023-03-03 LAB — HEMOGLOBIN A1C
Hgb A1c MFr Bld: 7.6 %{Hb} — ABNORMAL HIGH (ref <5.7)
Mean Plasma Glucose: 171 mg/dL
eAG (mmol/L): 9.5 mmol/L

## 2023-03-03 LAB — COMPLETE METABOLIC PANEL WITH GFR
AG Ratio: 1.9 (calc) (ref 1.0–2.5)
ALT: 19 U/L (ref 9–46)
AST: 24 U/L (ref 10–35)
Albumin: 4.3 g/dL (ref 3.6–5.1)
Alkaline phosphatase (APISO): 60 U/L (ref 35–144)
BUN/Creatinine Ratio: 22 (calc) (ref 6–22)
BUN: 46 mg/dL — ABNORMAL HIGH (ref 7–25)
CO2: 23 mmol/L (ref 20–32)
Calcium: 8.9 mg/dL (ref 8.6–10.3)
Chloride: 106 mmol/L (ref 98–110)
Creat: 2.13 mg/dL — ABNORMAL HIGH (ref 0.70–1.28)
Globulin: 2.3 g/dL (ref 1.9–3.7)
Glucose, Bld: 136 mg/dL — ABNORMAL HIGH (ref 65–99)
Potassium: 4.4 mmol/L (ref 3.5–5.3)
Sodium: 140 mmol/L (ref 135–146)
Total Bilirubin: 0.4 mg/dL (ref 0.2–1.2)
Total Protein: 6.6 g/dL (ref 6.1–8.1)
eGFR: 31 mL/min/{1.73_m2} — ABNORMAL LOW (ref >=60)

## 2023-03-03 LAB — LIPID PANEL
Cholesterol: 111 mg/dL (ref <200)
HDL: 36 mg/dL — ABNORMAL LOW (ref >=40)
LDL Cholesterol (Calc): 54 mg/dL
Non-HDL Cholesterol (Calc): 75 mg/dL (ref <130)
Total CHOL/HDL Ratio: 3.1 (calc) (ref <5.0)
Triglycerides: 120 mg/dL (ref <150)

## 2023-03-03 LAB — HEPATITIS C ANTIBODY: Hepatitis C Ab: NONREACTIVE

## 2023-03-04 ENCOUNTER — Encounter: Payer: Self-pay | Admitting: Family

## 2023-03-04 ENCOUNTER — Ambulatory Visit: Payer: Medicare Other | Admitting: Family

## 2023-03-04 VITALS — BP 127/88 | HR 89 | Temp 98.3°F | Resp 18 | Ht 68.0 in | Wt 148.0 lb

## 2023-03-04 DIAGNOSIS — Z0000 Encounter for general adult medical examination without abnormal findings: Secondary | ICD-10-CM | POA: Diagnosis not present

## 2023-03-04 NOTE — Patient Instructions (Signed)
[name] , Thank you for taking time to come for your Medicare Wellness Visit. I appreciate your ongoing commitment to your health goals. Please review the following plan we discussed and let me know if I can assist you in the future.   Screening recommendations/referrals: Colonoscopy N/A Recommended yearly ophthalmology/optometry visit for glaucoma screening and checkup Recommended yearly dental visit for hygiene and checkup  Vaccinations: Influenza vaccine due annually in September/October Pneumococcal vaccine up to date  Tdap vaccine up to date  Shingles vaccine up to date     Advanced directives: Has a Living will at home   Conditions/risks identified: advanced age (>40men, >79 women);diabetes mellitus;male gender;hypertension  Next appointment: 1 year   Preventive Care 29 Years and Older, Male Preventive care refers to lifestyle choices and visits with your health care provider that can promote health and wellness. What does preventive care include? A yearly physical exam. This is also called an annual well check. Dental exams once or twice a year. Routine eye exams. Ask your health care provider how often you should have your eyes checked. Personal lifestyle choices, including: Daily care of your teeth and gums. Regular physical activity. Eating a healthy diet. Avoiding tobacco and drug use. Limiting alcohol use. Practicing safe sex. Taking low doses of aspirin every day. Taking vitamin and mineral supplements as recommended by your health care provider. What happens during an annual well check? The services and screenings done by your health care provider during your annual well check will depend on your age, overall health, lifestyle risk factors, and family history of disease. Counseling  Your health care provider may ask you questions about your: Alcohol use. Tobacco use. Drug use. Emotional well-being. Home and relationship well-being. Sexual activity. Eating  habits. History of falls. Memory and ability to understand (cognition). Work and work Astronomer. Screening  You may have the following tests or measurements: Height, weight, and BMI. Blood pressure. Lipid and cholesterol levels. These may be checked every 5 years, or more frequently if you are over 43 years old. Skin check. Lung cancer screening. You may have this screening every year starting at age 60 if you have a 30-pack-year history of smoking and currently smoke or have quit within the past 15 years. Fecal occult blood test (FOBT) of the stool. You may have this test every year starting at age 71. Flexible sigmoidoscopy or colonoscopy. You may have a sigmoidoscopy every 5 years or a colonoscopy every 10 years starting at age 93. Prostate cancer screening. Recommendations will vary depending on your family history and other risks. Hepatitis C blood test. Hepatitis B blood test. Sexually transmitted disease (STD) testing. Diabetes screening. This is done by checking your blood sugar (glucose) after you have not eaten for a while (fasting). You may have this done every 1-3 years. Abdominal aortic aneurysm (AAA) screening. You may need this if you are a current or former smoker. Osteoporosis. You may be screened starting at age 74 if you are at high risk. Talk with your health care provider about your test results, treatment options, and if necessary, the need for more tests. Vaccines  Your health care provider may recommend certain vaccines, such as: Influenza vaccine. This is recommended every year. Tetanus, diphtheria, and acellular pertussis (Tdap, Td) vaccine. You may need a Td booster every 10 years. Zoster vaccine. You may need this after age 64. Pneumococcal 13-valent conjugate (PCV13) vaccine. One dose is recommended after age 40. Pneumococcal polysaccharide (PPSV23) vaccine. One dose is recommended after  age 64. Talk to your health care provider about which screenings and  vaccines you need and how often you need them. This information is not intended to replace advice given to you by your health care provider. Make sure you discuss any questions you have with your health care provider. Document Released: 06/15/2015 Document Revised: 02/06/2016 Document Reviewed: 03/20/2015 Elsevier Interactive Patient Education  2017 [employer].  Fall Prevention in the Home Falls can cause injuries. They can happen to people of all ages. There are many things you can do to make your home safe and to help prevent falls. What can I do on the outside of my home? Regularly fix the edges of walkways and driveways and fix any cracks. Remove anything that might make you trip as you walk through a door, such as a raised step or threshold. Trim any bushes or trees on the path to your home. Use bright outdoor lighting. Clear any walking paths of anything that might make someone trip, such as rocks or tools. Regularly check to see if handrails are loose or broken. Make sure that both sides of any steps have handrails. Any raised decks and porches should have guardrails on the edges. Have any leaves, snow, or ice cleared regularly. Use sand or salt on walking paths during winter. Clean up any spills in your garage right away. This includes oil or grease spills. What can I do in the bathroom? Use night lights. Install grab bars by the toilet and in the tub and shower. Do not use towel bars as grab bars. Use non-skid mats or decals in the tub or shower. If you need to sit down in the shower, use a plastic, non-slip stool. Keep the floor dry. Clean up any water that spills on the floor as soon as it happens. Remove soap buildup in the tub or shower regularly. Attach bath mats securely with double-sided non-slip rug tape. Do not have throw rugs and other things on the floor that can make you trip. What can I do in the bedroom? Use night lights. Make sure that you have a light by your  bed that is easy to reach. Do not use any sheets or blankets that are too big for your bed. They should not hang down onto the floor. Have a firm chair that has side arms. You can use this for support while you get dressed. Do not have throw rugs and other things on the floor that can make you trip. What can I do in the kitchen? Clean up any spills right away. Avoid walking on wet floors. Keep items that you use a lot in easy-to-reach places. If you need to reach something above you, use a strong step stool that has a grab bar. Keep electrical cords out of the way. Do not use floor polish or wax that makes floors slippery. If you must use wax, use non-skid floor wax. Do not have throw rugs and other things on the floor that can make you trip. What can I do with my stairs? Do not leave any items on the stairs. Make sure that there are handrails on both sides of the stairs and use them. Fix handrails that are broken or loose. Make sure that handrails are as long as the stairways. Check any carpeting to make sure that it is firmly attached to the stairs. Fix any carpet that is loose or worn. Avoid having throw rugs at the top or bottom of the stairs. If you do  have throw rugs, attach them to the floor with carpet tape. Make sure that you have a light switch at the top of the stairs and the bottom of the stairs. If you do not have them, ask someone to add them for you. What else can I do to help prevent falls? Wear shoes that: Do not have high heels. Have rubber bottoms. Are comfortable and fit you well. Are closed at the toe. Do not wear sandals. If you use a stepladder: Make sure that it is fully opened. Do not climb a closed stepladder. Make sure that both sides of the stepladder are locked into place. Ask someone to hold it for you, if possible. Clearly mark and make sure that you can see: Any grab bars or handrails. First and last steps. Where the edge of each step is. Use tools that  help you move around (mobility aids) if they are needed. These include: Canes. Walkers. Scooters. Crutches. Turn on the lights when you go into a dark area. Replace any light bulbs as soon as they burn out. Set up your furniture so you have a clear path. Avoid moving your furniture around. If any of your floors are uneven, fix them. If there are any pets around you, be aware of where they are. Review your medicines with your doctor. Some medicines can make you feel dizzy. This can increase your chance of falling. Ask your doctor what other things that you can do to help prevent falls. This information is not intended to replace advice given to you by your health care provider. Make sure you discuss any questions you have with your health care provider. Document Released: 03/15/2009 Document Revised: 10/25/2015 Document Reviewed: 06/23/2014 Elsevier Interactive Patient Education  2017 [employer].

## 2023-03-04 NOTE — Progress Notes (Deleted)
This service is provided via telemedicine  No vital signs collected/recorded due to the encounter was a telemedicine visit.   Location of patient (ex: home, work):  Home   Patient consents to a telephone visit:  Yes, 03/04/23  Location of the provider (ex: office, home):  [facility]  Name of any referring provider:  Ngetich, Donalee Citrin, NP   Names of all persons participating in the telemedicine service and their role in the encounter: Tarence Searcy B/CMA, Ngetich, Dinah C, NP , and patient  Time spent on call:  11 minutes

## 2023-03-04 NOTE — Progress Notes (Signed)
Subjective:   Jordan West is a 77 y.o. male who presents for Medicare Annual/Subsequent preventive examination.  Visit Complete: In person  Patient Medicare AWV questionnaire was completed by the patient on 03/04/2023 ; I have confirmed that all information answered by patient is correct and no changes since this date.  Cardiac Risk Factors include: advanced age (>33men, >70 women);diabetes mellitus;male gender;hypertension     Objective:    Today's Vitals   03/04/23 0947  BP: 127/88  Pulse: 89  Resp: 18  Temp: 98.3 F (36.8 C)  SpO2: 99%  Weight: 148 lb (67.1 kg)  Height: 5\' 8"  (1.727 m)   Body mass index is 22.5 kg/m.     03/04/2023    8:30 AM 09/03/2022    8:46 AM 02/26/2022   10:07 AM 06/13/2019    9:09 PM 09/15/2013    8:50 PM 09/08/2013    3:40 PM  Advanced Directives  Does Patient Have a Medical Advance Directive? No  No No Patient does not have advance directive;Patient would not like information Patient does not have advance directive;Patient would like information  Type of Advance Directive  Healthcare Power of [place];Living will      Copy of Healthcare Power of Attorney in Chart?  No - copy requested      Would patient like information on creating a medical advance directive? No - Patient declined  No - Patient declined No - Patient declined  Advance directive packet given    Current Medications (verified) Outpatient Encounter Medications as of 03/04/2023  Medication Sig   albuterol (PROVENTIL HFA;VENTOLIN HFA) 108 (90 BASE) MCG/ACT inhaler Inhale 1 puff into the lungs every 6 (six) hours as needed for wheezing or shortness of breath.   Apoaequorin (PREVAGEN PO) Take 1 tablet by mouth daily.   atorvastatin (LIPITOR) 20 MG tablet Take 20 mg by mouth daily.   cholecalciferol (VITAMIN D3) 25 MCG (1000 UNIT) tablet Take 1,000 Units by mouth daily.   FARXIGA 10 MG TABS tablet Take 10 mg by mouth daily.   Fluticasone-Salmeterol (ADVAIR) 500-50 MCG/DOSE AEPB Inhale  1 puff into the lungs 2 (two) times daily.   fluticasone-salmeterol (WIXELA INHUB) 500-50 MCG/ACT AEPB Inhale 1 puff into the lungs in the morning and at bedtime.   glucose blood test strip as directed.   hydrochlorothiazide (HYDRODIURIL) 25 MG tablet Take 25 mg by mouth daily.   Lancets (ONETOUCH ULTRASOFT) lancets as directed.   losartan (COZAAR) 100 MG tablet Take 100 mg by mouth every evening.    metFORMIN (GLUCOPHAGE) 1000 MG tablet Take 0.5 tablets (500 mg total) by mouth 2 (two) times daily with a meal.   Multiple Vitamin (MULTIVITAMIN WITH MINERALS) TABS tablet Take 1 tablet by mouth daily.   naproxen sodium (ALEVE) 220 MG tablet Take 220 mg by mouth 2 (two) times daily as needed (pain).   omeprazole (PRILOSEC) 20 MG capsule Take 20 mg by mouth daily before breakfast.   [DISCONTINUED] gabapentin (NEURONTIN) 300 MG capsule Take 300 mg by mouth 2 (two) times daily. (Patient not taking: Reported on 09/03/2022)   No facility-administered encounter medications on file as of 03/04/2023.    Allergies (verified) Amlodipine and Aspirin   History: Past Medical History:  Diagnosis Date   Asthma    Diabetes mellitus without complication (HCC)    GERD (gastroesophageal reflux disease)    High cholesterol    History of colonoscopy    Hx of hypercholesterolemia    PMH   Hypertension  Leg pain    Pneumonia    Past Surgical History:  Procedure Laterality Date   COLONOSCOPY     DECOMPRESSIVE LUMBAR LAMINECTOMY LEVEL 2 N/A 09/14/2013   Procedure: Lumbar Four-Sacral One Decompression and Transforaminal Lumbar Four-Five Interbody and Lumbar Four-Sacral One Fusion;  Surgeon: Venita Lick, MD;  Location: [facility] OR;  Service: Orthopedics;  Laterality: N/A;   FRACTURE SURGERY     left pinky   LUNG SURGERY     Family History  Problem Relation Age of Onset   Cancer - Colon Mother    Diabetes Mother    Diabetes Father    Heart disease Father    Diabetes Sister    Diabetes Brother    Asthma  Other    Leukemia Other    Cancer - Prostate Other    Social History   Socioeconomic History   Marital status: Married    Spouse name: Not on file   Number of children: Not on file   Years of education: Not on file   Highest education level: Not on file  Occupational History   Not on file  Tobacco Use   Smoking status: Never   Smokeless tobacco: Never  Substance and Sexual Activity   Alcohol use: No   Drug use: No   Sexual activity: Not on file  Other Topics Concern   Not on file  Social History Narrative   Tobacco use, amount per day now: None   Past tobacco use, amount per day: None   How many years did you use tobacco: None   Alcohol use (drinks per week): None   Diet: None   Do you drink/eat things with caffeine: Diet Soda   Marital status: Married                                 What year were you married? 1970   Do you live in a house, apartment, assisted living, condo, trailer, etc.? House   Is it one or more stories? 1   How many persons live in your home? 2   Do you have pets in your home?( please list) None   Highest Level of education completed? Bachelors   Current or past profession: Computer   Do you exercise?   Yes                               Type and how often? Walk Daily    Do you have a living will? Yes   Do you have a DNR form?  Yes                                 If not, do you want to discuss one?   Do you have signed POA/HPOA forms?  Yes                      If so, please bring to you appointment      Do you have any difficulty bathing or dressing yourself? No   Do you have any difficulty preparing food or eating? No   Do you have any difficulty managing your medications? No   Do you have any difficulty managing your finances? No   Do you have any difficulty affording your medications? NO   Social Determinants  of Health   Financial Resource Strain: Low Risk  (01/09/2022)   Received from [facility], [facility]   Overall Financial  Resource Strain (CARDIA)    Difficulty of Paying Living Expenses: Not hard at all  Food Insecurity: No Food Insecurity (01/09/2022)   Received from [facility], [facility]   Hunger Vital Sign    Worried About Running Out of Food in the Last Year: Never true    Ran Out of Food in the Last Year: Never true  Transportation Needs: No Transportation Needs (10/06/2022)   Received from [facility] - Transportation    Lack of Transportation (Medical): No    Lack of Transportation (Non-Medical): No  Physical Activity: Sufficiently Active (01/06/2022)   Received from [facility]   Exercise Vital Sign    Days of Exercise per Week: 7 days    Minutes of Exercise per Session: 30 min  Stress: Not on file  Social Connections: Moderately Isolated (01/06/2022)   Received from [facility], [facility]   Social Connection and Isolation Panel [NHANES]    Frequency of Communication with Friends and Family: More than three times a week    Frequency of Social Gatherings with Friends and Family: More than three times a week    Attends Religious Services: Never    Database administrator or Organizations: No    Attends Engineer, structural: Never    Marital Status: Married    Tobacco Counseling Counseling given: Not Answered   Clinical Intake:  Pre-visit preparation completed: No  Pain : 0-10 Pain Score:  (worst at times) Pain Type: Chronic pain Pain Location: Knee Pain Orientation: Left, Right Pain Radiating Towards: no Pain Descriptors / Indicators: Aching Pain Onset: More than a month ago Pain Frequency: Intermittent Pain Relieving Factors: Aleve Effect of Pain on Daily Activities: walking on the steps  Pain Relieving Factors: Aleve  BMI - recorded: 22.5 Nutritional Status: BMI of 19-24  Normal Nutritional Risks: None Diabetes: Yes CBG done?: Yes (177 at home) CBG resulted in Enter/ Edit results?: No Did pt. bring in CBG monitor  from home?: No  How often do you need to have someone help you when you read instructions, pamphlets, or other written materials from your doctor or pharmacy?: 1 - Never What is the last grade level you completed in school?: 16 yrs BSC in math  Interpreter Needed?: No      Activities of Daily Living    03/04/2023   10:13 AM 03/04/2023    9:33 AM  In your present state of health, do you have any difficulty performing the following activities:  Hearing? 0 0  Vision? 0 0  Difficulty concentrating or making decisions? 0 0  Walking or climbing stairs? 1 1  Comment Knee pain   Dressing or bathing? 0 0  Doing errands, shopping? 0 0  Preparing Food and eating ? N   Using the Toilet? N   In the past six months, have you accidently leaked urine? N   Do you have problems with loss of bowel control? N   Managing your Medications? N   Managing your Finances? N   Housekeeping or managing your Housekeeping? N     Patient Care Team: Rishon Thilges, Donalee Citrin, NP as PCP - General (Family Medicine)  Indicate any recent Medical Services you may have received from other than [facility] providers in the past year (date may  be approximate).     Assessment:   This is a routine wellness examination for [place].  Hearing/Vision screen No results found.   Goals Addressed   None    Depression Screen    09/03/2022    8:46 AM 02/28/2022   11:04 AM  PHQ 2/9 Scores  PHQ - 2 Score 0 0    Fall Risk    09/03/2022    8:46 AM 02/28/2022   11:03 AM 02/26/2022   10:07 AM  Fall Risk   Falls in the past year? 0 0 0  Number falls in past yr: 0 0 0  Injury with Fall? 0 0 0  Risk for fall due to :  History of fall(s) No Fall Risks  Follow up  Falls evaluation completed Falls evaluation completed    MEDICARE RISK AT HOME:    TIMED UP AND GO:  Was the test performed?  Yes  Length of time to ambulate 10 feet: 10 sec Gait slow and steady without use of assistive device    Cognitive Function:    03/04/2023     9:50 AM  MMSE - Mini Mental State Exam  Orientation to time 5  Orientation to Place 5  Registration 3  Attention/ Calculation 5  Recall 3  Language- name 2 objects 2  Language- repeat 1  Language- follow 3 step command 3  Language- read & follow direction 1  Write a sentence 1  Copy design 1  Total score 30        03/04/2023    9:31 AM  6CIT Screen  What Year? 0 points  What month? 0 points  What time? 0 points  Count back from 20 0 points  Months in reverse 0 points  Repeat phrase 0 points  Total Score 0 points    Immunizations Immunization History  Administered Date(s) Administered   DTaP 12/26/2022   Fluad Quad(high Dose 65+) 02/26/2022   Influenza, Seasonal, Injecte, Preservative Fre 03/24/2011   Influenza,inj,Quad PF,6+ Mos 03/18/2017, 02/17/2018, 02/09/2019, 04/10/2020   Influenza,inj,quad, With Preservative 03/29/2018   Influenza-Unspecified 06/03/1999, 04/17/2010, 03/19/2015, 05/02/2016, 03/19/2021   Moderna Covid-19 Fall Seasonal Vaccine 43yrs & older 08/22/2022, 01/10/2023   PFIZER Comirnaty(Gray Top)Covid-19 Tri-Sucrose Vaccine 09/11/2020   PFIZER(Purple Top)SARS-COV-2 Vaccination 09/22/2019, 10/17/2019, 04/18/2020   Pfizer Covid-19 Vaccine Bivalent Booster 31yrs & up 03/19/2021, 01/02/2022   Pneumococcal Conjugate-13 06/03/2015   Pneumococcal Polysaccharide-23 04/17/2010, 08/05/2016   Pneumococcal-Unspecified 07/14/2016   Td (Adult),unspecified 01/07/2013   Zoster Recombinant(Shingrix) 10/11/2022, 12/27/2022    TDAP status: Up to date  Flu Vaccine status: Due, Education has been provided regarding the importance of this vaccine. Advised may receive this vaccine at local pharmacy or Health Dept. Aware to provide a copy of the vaccination record if obtained from local pharmacy or Health Dept. Verbalized acceptance and understanding.  Pneumococcal vaccine status: Up to date  Covid-19 vaccine status: Information provided on how to obtain vaccines.    Qualifies for Shingles Vaccine? Yes   Zostavax completed No   Shingrix Completed?: Yes  Screening Tests Health Maintenance  Topic Date Due   FOOT EXAM  Never done   OPHTHALMOLOGY EXAM  Never done   INFLUENZA VACCINE  01/01/2023   HEMOGLOBIN A1C  08/30/2023   Diabetic kidney evaluation - Urine ACR  02/05/2024   Diabetic kidney evaluation - eGFR measurement  03/01/2024   Medicare Annual Wellness (AWV)  03/03/2024   DTaP/Tdap/Td (2 - Tdap) 12/25/2032   Pneumonia Vaccine 51+ Years old  Completed  COVID-19 Vaccine  Completed   Hepatitis C Screening  Completed   Zoster Vaccines- Shingrix  Completed   HPV VACCINES  Aged Out    Health Maintenance  Health Maintenance Due  Topic Date Due   FOOT EXAM  Never done   OPHTHALMOLOGY EXAM  Never done   INFLUENZA VACCINE  01/01/2023    Colorectal cancer screening: No longer required.   Lung Cancer Screening: (Low Dose CT Chest recommended if Age 87-80 years, 20 pack-year currently smoking OR have quit w/in 15years.) does not qualify.   Lung Cancer Screening Referral: No   Additional Screening:  Hepatitis C Screening: does not qualify; Completed yes   Vision Screening: Recommended annual ophthalmology exams for early detection of glaucoma and other disorders of the eye. Is the patient up to date with their annual eye exam?  Yes  Who is the provider or what is the name of the office in which the patient attends annual eye exams? Dr.Glenn If pt is not established with a provider, would they like to be referred to a provider to establish care? No .   Dental Screening: Recommended annual dental exams for proper oral hygiene  Diabetic Foot Exam: Diabetic Foot Exam: Completed 03/04/2023  Community Resource Referral / Chronic Care Management: CRR required this visit?  Yes   CCM required this visit?  No     Plan:     I have personally reviewed and noted the following in the patient's chart:   Medical and social history Use of  alcohol, tobacco or illicit drugs  Current medications and supplements including opioid prescriptions. Patient is not currently taking opioid prescriptions. Functional ability and status Nutritional status Physical activity Advanced directives List of other physicians Hospitalizations, surgeries, and ER visits in previous 12 months Vitals Screenings to include cognitive, depression, and falls Referrals and appointments  In addition, I have reviewed and discussed with patient certain preventive protocols, quality metrics, and best practice recommendations. A written personalized care plan for preventive services as well as general preventive health recommendations were provided to patient.     Caesar Bookman, NP   03/04/2023   After Visit Summary: (In Person-Declined) Patient declined AVS at this time.  Nurse Notes: will get Influenza vaccine at the end of this week.

## 2023-03-04 NOTE — Progress Notes (Deleted)
I connected with  Jordan West on 03/04/23 by a video enabled telemedicine application and verified that I am speaking with the correct person using two identifiers.   I discussed the limitations of evaluation and management by telemedicine. The patient expressed understanding and agreed to proceed.

## 2023-03-05 ENCOUNTER — Ambulatory Visit: Payer: Medicare Other | Admitting: Family

## 2023-03-05 ENCOUNTER — Encounter: Payer: Self-pay | Admitting: Family

## 2023-03-05 VITALS — BP 140/80 | HR 77 | Temp 97.9°F | Resp 14 | Ht 68.0 in | Wt 149.2 lb

## 2023-03-05 DIAGNOSIS — E781 Pure hyperglyceridemia: Secondary | ICD-10-CM | POA: Diagnosis not present

## 2023-03-05 DIAGNOSIS — E1140 Type 2 diabetes mellitus with diabetic neuropathy, unspecified: Secondary | ICD-10-CM | POA: Diagnosis not present

## 2023-03-05 DIAGNOSIS — I129 Hypertensive chronic kidney disease with stage 1 through stage 4 chronic kidney disease, or unspecified chronic kidney disease: Secondary | ICD-10-CM

## 2023-03-05 DIAGNOSIS — J4520 Mild intermittent asthma, uncomplicated: Secondary | ICD-10-CM | POA: Diagnosis not present

## 2023-03-05 DIAGNOSIS — N1832 Chronic kidney disease, stage 3b: Secondary | ICD-10-CM

## 2023-03-05 DIAGNOSIS — N183 Chronic kidney disease, stage 3 unspecified: Secondary | ICD-10-CM

## 2023-03-05 MED ORDER — METFORMIN HCL 1000 MG PO TABS: 1000.0000 mg | ORAL_TABLET | Freq: Two times a day (BID) | ORAL | 1 refills | Status: DC

## 2023-03-05 NOTE — Progress Notes (Signed)
Provider: Richarda Blade FNP-C   Mckell Riecke, Donalee Citrin, NP  Patient Care Team: Corene Resnick, Donalee Citrin, NP as PCP - General (Family Medicine)  Extended Emergency Contact Information Primary Emergency Contact: Delpriore,Renee Address: [address]           [place], [place] [address]          [place], [place] [address] Darden [place] of [place] Phone: [phone] Relation: Daughter Secondary Emergency Contact: Andon,Beverly Address: [address]          [place], [place] [address] Darden Amber of [place] Mobile Phone: [phone] Relation: Spouse  Code Status:  Full Code  Goals of care: Advanced Directive information    03/05/2023    9:19 AM  Advanced Directives  Does Patient Have a Medical Advance Directive? No     Chief Complaint  Patient presents with   Follow-up    6 month follow up - [facility] reviewed    HPI:  Pt is a 77 y.o. male seen today for 6 months follow up for medical management of chronic diseases. He denies any acute issues.   Hypertension - home B/p runs in the 130's -140's.He denies any headache,dizziness,vision changes,fatigue,chest tightness,palpitation,chest pain or shortness of breath.     Hyperlipidemia - eats three meals at home and eats out. Walks one mile and some days more daily.   Type 2 DM - CBG runs 170's -180's .A1C 7.6  Has appointment with ophthalmologist next year with Dr.Glenn.He denies any vision changes.Also denies any numbness or tingling on the feet. No weight loss.   Immunization - declined influenza vaccine today states will get vaccine at the pharmacy with wife.   Past Medical History:  Diagnosis Date   Asthma    Diabetes mellitus without complication (HCC)    GERD (gastroesophageal reflux disease)    High cholesterol    History of colonoscopy    Hx of hypercholesterolemia    PMH   Hypertension    Leg pain    Pneumonia    Past Surgical History:  Procedure Laterality Date   COLONOSCOPY     DECOMPRESSIVE LUMBAR LAMINECTOMY  LEVEL 2 N/A 09/14/2013   Procedure: Lumbar Four-Sacral One Decompression and Transforaminal Lumbar Four-Five Interbody and Lumbar Four-Sacral One Fusion;  Surgeon: Venita Lick, MD;  Location: [facility] OR;  Service: Orthopedics;  Laterality: N/A;   FRACTURE SURGERY     left pinky   LUNG SURGERY      Allergies  Allergen Reactions   Amlodipine     rash   Aspirin Other (See Comments)    Bleeding in GI tract    Allergies as of 03/05/2023       Reactions   Amlodipine    rash   Aspirin Other (See Comments)   Bleeding in GI tract        Medication List        Accurate as of March 05, 2023  9:34 AM. If you have any questions, ask your nurse or doctor.          albuterol 108 (90 Base) MCG/ACT inhaler Commonly known as: VENTOLIN HFA Inhale 1 puff into the lungs every 6 (six) hours as needed for wheezing or shortness of breath.   atorvastatin 20 MG tablet Commonly known as: LIPITOR Take 20 mg by mouth daily.   cholecalciferol 25 MCG (1000 UNIT) tablet Commonly known as: VITAMIN D3 Take 1,000 Units by mouth daily.   Farxiga 10 MG Tabs tablet Generic drug: dapagliflozin propanediol Take 10 mg by  mouth daily.   Fluticasone-Salmeterol 500-50 MCG/DOSE Aepb Commonly known as: ADVAIR Inhale 1 puff into the lungs 2 (two) times daily.   Wixela Inhub 500-50 MCG/ACT Aepb Generic drug: fluticasone-salmeterol Inhale 1 puff into the lungs in the morning and at bedtime.   glucose blood test strip as directed.   hydrochlorothiazide 25 MG tablet Commonly known as: HYDRODIURIL Take 25 mg by mouth daily.   losartan 100 MG tablet Commonly known as: COZAAR Take 100 mg by mouth every evening.   metFORMIN 1000 MG tablet Commonly known as: GLUCOPHAGE Take 0.5 tablets (500 mg total) by mouth 2 (two) times daily with a meal.   multivitamin with minerals Tabs tablet Take 1 tablet by mouth daily.   naproxen sodium 220 MG tablet Commonly known as: ALEVE Take 220 mg by mouth 2 (two)  times daily as needed (pain).   omeprazole 20 MG capsule Commonly known as: PRILOSEC Take 20 mg by mouth daily before breakfast.   onetouch ultrasoft lancets as directed.   PREVAGEN PO Take 1 tablet by mouth daily.        Review of Systems  Constitutional:  Negative for appetite change, chills, fatigue, fever and unexpected weight change.  HENT:  Negative for congestion, dental problem, ear discharge, ear pain, facial swelling, hearing loss, nosebleeds, postnasal drip, rhinorrhea, sinus pressure, sinus pain, sneezing, sore throat, tinnitus and trouble swallowing.   Eyes:  Negative for pain, discharge, redness, itching and visual disturbance.  Respiratory:  Negative for cough, chest tightness, shortness of breath and wheezing.   Cardiovascular:  Negative for chest pain, palpitations and leg swelling.  Gastrointestinal:  Negative for abdominal distention, abdominal pain, blood in stool, constipation, diarrhea, nausea and vomiting.  Endocrine: Negative for cold intolerance, heat intolerance, polydipsia, polyphagia and polyuria.  Genitourinary:  Negative for difficulty urinating, dysuria, flank pain, frequency and urgency.  Musculoskeletal:  Negative for arthralgias, back pain, gait problem, joint swelling, myalgias, neck pain and neck stiffness.  Skin:  Negative for color change, pallor, rash and wound.  Neurological:  Negative for dizziness, syncope, speech difficulty, weakness, light-headedness, numbness and headaches.  Hematological:  Does not bruise/bleed easily.  Psychiatric/Behavioral:  Negative for agitation, behavioral problems, confusion, hallucinations, self-injury, sleep disturbance and suicidal ideas. The patient is not nervous/anxious.     Immunization History  Administered Date(s) Administered   DTaP 12/26/2022   Fluad Quad(high Dose 65+) 02/26/2022   Influenza, Seasonal, Injecte, Preservative Fre 03/24/2011   Influenza,inj,Quad PF,6+ Mos 03/18/2017, 02/17/2018,  02/09/2019, 04/10/2020   Influenza,inj,quad, With Preservative 03/29/2018   Influenza-Unspecified 06/03/1999, 04/17/2010, 03/19/2015, 05/02/2016, 03/19/2021   Moderna Covid-19 Fall Seasonal Vaccine 37yrs & older 08/22/2022, 01/10/2023   PFIZER Comirnaty(Gray Top)Covid-19 Tri-Sucrose Vaccine 09/11/2020   PFIZER(Purple Top)SARS-COV-2 Vaccination 09/22/2019, 10/17/2019, 04/18/2020   Pfizer Covid-19 Vaccine Bivalent Booster 84yrs & up 03/19/2021, 01/02/2022   Pneumococcal Conjugate-13 06/03/2015   Pneumococcal Polysaccharide-23 04/17/2010, 08/05/2016   Pneumococcal-Unspecified 07/14/2016   Td (Adult),unspecified 01/07/2013   Zoster Recombinant(Shingrix) 10/11/2022, 12/27/2022   Pertinent  Health Maintenance Due  Topic Date Due   OPHTHALMOLOGY EXAM  Never done   INFLUENZA VACCINE  01/01/2023   HEMOGLOBIN A1C  08/30/2023   FOOT EXAM  03/03/2024      06/14/2019    6:01 AM 02/26/2022   10:07 AM 02/28/2022   11:03 AM 09/03/2022    8:46 AM 03/05/2023    9:19 AM  Fall Risk  Falls in the past year?  0 0 0 0  Was there an injury with Fall?  0 0  0   Fall Risk Category Calculator  0 0 0   Fall Risk Category (Retired)  Low Low    (RETIRED) Patient Fall Risk Level Low fall risk Low fall risk Low fall risk    Patient at Risk for Falls Due to  No Fall Risks History of fall(s)    Fall risk Follow up  Falls evaluation completed Falls evaluation completed     Functional Status Survey:    Vitals:   03/05/23 0919 03/05/23 0921  BP: (!) 140/80 (!) 140/80  Pulse: 77   Resp: 14   Temp: 97.9 F (36.6 C)   SpO2: 97%   Weight: 149 lb 3.2 oz (67.7 kg)   Height: 5\' 8"  (1.727 m)    Body mass index is 22.69 kg/m. Physical Exam Vitals reviewed.  Constitutional:      General: He is not in acute distress.    Appearance: Normal appearance. He is normal weight. He is not ill-appearing or diaphoretic.  HENT:     Head: Normocephalic.     Right Ear: Tympanic membrane, ear canal and external ear normal.  There is no impacted cerumen.     Left Ear: Tympanic membrane, ear canal and external ear normal. There is no impacted cerumen.     Nose: Nose normal. No congestion or rhinorrhea.     Mouth/Throat:     Mouth: Mucous membranes are moist.     Pharynx: Oropharynx is clear. No oropharyngeal exudate or posterior oropharyngeal erythema.  Eyes:     General: No scleral icterus.       Right eye: No discharge.        Left eye: No discharge.     Extraocular Movements: Extraocular movements intact.     Conjunctiva/sclera: Conjunctivae normal.     Pupils: Pupils are equal, round, and reactive to light.  Neck:     Vascular: No carotid bruit.  Cardiovascular:     Rate and Rhythm: Normal rate and regular rhythm.     Pulses: Normal pulses.     Heart sounds: Normal heart sounds. No murmur heard.    No friction rub. No gallop.  Pulmonary:     Effort: Pulmonary effort is normal. No respiratory distress.     Breath sounds: Normal breath sounds. No wheezing, rhonchi or rales.  Chest:     Chest wall: No tenderness.  Abdominal:     General: Bowel sounds are normal. There is no distension.     Palpations: Abdomen is soft. There is no mass.     Tenderness: There is no abdominal tenderness. There is no right CVA tenderness, left CVA tenderness, guarding or rebound.  Musculoskeletal:        General: No swelling or tenderness. Normal range of motion.     Cervical back: Normal range of motion. No rigidity or tenderness.     Right lower leg: No edema.     Left lower leg: No edema.  Lymphadenopathy:     Cervical: No cervical adenopathy.  Skin:    General: Skin is warm and dry.     Coloration: Skin is not pale.     Findings: No bruising, erythema, lesion or rash.  Neurological:     Mental Status: He is alert and oriented to person, place, and time.     Cranial Nerves: No cranial nerve deficit.     Sensory: No sensory deficit.     Motor: No weakness.     Coordination: Coordination normal.     Gait: Gait  normal.  Psychiatric:        Mood and Affect: Mood normal.        Speech: Speech normal.        Behavior: Behavior normal.        Thought Content: Thought content normal.        Judgment: Judgment normal.     Labs reviewed: Recent Labs    08/22/22 0803 02/05/23 0000 03/02/23 0811  NA 138 137 140  K 4.4 5.3* 4.4  CL 106 106 106  CO2 24 21 23   GLUCOSE 150*  --  136*  BUN 40* 49* 46*  CREATININE 2.05* 2.2* 2.13*  CALCIUM 9.3 9.4 8.9   Recent Labs    08/22/22 0803 02/05/23 0000 03/02/23 0811  AST 22  --  24  ALT 17  --  19  BILITOT 0.3  --  0.4  PROT 6.5  --  6.6  ALBUMIN  --  4.6  --    Recent Labs    08/22/22 0803 02/05/23 0000 03/02/23 0811  WBC 7.7  --  7.8  NEUTROABS 4,066  --  4,680  HGB 10.1* 9.0* 10.0*  HCT 30.4*  --  30.6*  MCV 90.7  --  90.5  PLT 198  --  194   Lab Results  Component Value Date   TSH 2.22 03/02/2023   Lab Results  Component Value Date   HGBA1C 7.6 (H) 03/02/2023   Lab Results  Component Value Date   CHOL 111 03/02/2023   HDL 36 (L) 03/02/2023   LDLCALC 54 03/02/2023   TRIG 120 03/02/2023   CHOLHDL 3.1 03/02/2023    Significant Diagnostic Results in last 30 days:  No results found.  Assessment/Plan  1. Controlled type 2 diabetes with neuropathy (HCC) Lab Results  Component Value Date   HGBA1C 7.6 (H) 03/02/2023  Increase metformin from 500 mg tablet twice daily to 1000 mg tablet twice daily.  - metFORMIN (GLUCOPHAGE) 1000 MG tablet; Take 1 tablet (1,000 mg total) by mouth 2 (two) times daily with a meal.  Dispense: 90 tablet; Refill: 1  2. Benign hypertension with CKD (chronic kidney disease) stage III (HCC) B/p slightly elevated this visit but has been in the 120's -130's  - Advised to check Blood pressure at home and notify provider if B/p > 140/90  - continue on losartan and hydrochlorothiazide   3. Mild intermittent asthma without complication Breathing stable  - continue on Wixela,Advair and albuterol     4. Hypertriglyceridemia Latest TRG well controlled  - Continue on atorvastatin   5. Stage 3b chronic kidney disease (HCC) Latest CR 2.13 - continue on farxiga  - continue to follow up with Nephrologist  - continue to avoid NSAID's and dose all other medication for renal clearance   Family/ staff Communication: Reviewed plan of care with patient verbalized understanding   Labs/tests ordered:  - CBC with Differential/Platelet - CMP with eGFR([facility]) - TSH - Hgb A1C - Lipid panel  Next Appointment : Return in about 6 months (around 09/03/2023) for medical mangement of chronic issues., Fasting labs in 6 months prior to visit.   Caesar Bookman, NP

## 2023-03-07 ENCOUNTER — Other Ambulatory Visit: Payer: Self-pay | Admitting: Family

## 2023-03-07 DIAGNOSIS — E1140 Type 2 diabetes mellitus with diabetic neuropathy, unspecified: Secondary | ICD-10-CM

## 2023-03-26 DIAGNOSIS — Z85828 Personal history of other malignant neoplasm of skin: Secondary | ICD-10-CM | POA: Diagnosis not present

## 2023-03-26 DIAGNOSIS — D2262 Melanocytic nevi of left upper limb, including shoulder: Secondary | ICD-10-CM | POA: Diagnosis not present

## 2023-03-26 DIAGNOSIS — D225 Melanocytic nevi of trunk: Secondary | ICD-10-CM | POA: Diagnosis not present

## 2023-03-26 DIAGNOSIS — L2089 Other atopic dermatitis: Secondary | ICD-10-CM | POA: Diagnosis not present

## 2023-04-06 DIAGNOSIS — K080 Exfoliation of teeth due to systemic causes: Secondary | ICD-10-CM | POA: Diagnosis not present

## 2023-04-24 ENCOUNTER — Ambulatory Visit (INDEPENDENT_AMBULATORY_CARE_PROVIDER_SITE_OTHER): Payer: Medicare Other | Admitting: Adult Health

## 2023-04-24 ENCOUNTER — Encounter: Payer: Self-pay | Admitting: Adult Health

## 2023-04-24 VITALS — BP 128/88 | HR 85 | Temp 99.1°F | Resp 18 | Ht 68.0 in | Wt 153.2 lb

## 2023-04-24 DIAGNOSIS — J209 Acute bronchitis, unspecified: Secondary | ICD-10-CM | POA: Diagnosis not present

## 2023-04-24 DIAGNOSIS — I129 Hypertensive chronic kidney disease with stage 1 through stage 4 chronic kidney disease, or unspecified chronic kidney disease: Secondary | ICD-10-CM | POA: Diagnosis not present

## 2023-04-24 DIAGNOSIS — N183 Chronic kidney disease, stage 3 unspecified: Secondary | ICD-10-CM | POA: Diagnosis not present

## 2023-04-24 MED ORDER — AZITHROMYCIN 250 MG PO TABS: ORAL_TABLET | ORAL | 0 refills | Status: AC

## 2023-04-24 NOTE — Progress Notes (Unsigned)
[facility] clinic  Provider:   Code Status: *** Goals of Care:     03/05/2023    9:19 AM  Advanced Directives  Does Patient Have a Medical Advance Directive? No     Chief Complaint  Patient presents with   Acute Visit    Bad cough. Patient did a home test for Covid and it was negative     HPI: Patient is a 77 y.o. male seen today for an acute visit for  Has been cough with whitish phlegm X 1 week Has occasional SOB, no wheezing Tested negative for COVID-19 this morning No sick exposure No chills, has low grade fever 99 No sore throat and no body aches   Took diabetic tussin didn't work Had 5 covid-19 vaccine  CBG 167 average  Past Medical History:  Diagnosis Date   Asthma    Diabetes mellitus without complication (HCC)    GERD (gastroesophageal reflux disease)    High cholesterol    History of colonoscopy    Hx of hypercholesterolemia    PMH   Hypertension    Leg pain    Pneumonia     Past Surgical History:  Procedure Laterality Date   COLONOSCOPY     DECOMPRESSIVE LUMBAR LAMINECTOMY LEVEL 2 N/A 09/14/2013   Procedure: Lumbar Four-Sacral One Decompression and Transforaminal Lumbar Four-Five Interbody and Lumbar Four-Sacral One Fusion;  Surgeon: Venita Lick, MD;  Location: [facility] OR;  Service: Orthopedics;  Laterality: N/A;   FRACTURE SURGERY     left pinky   LUNG SURGERY      Allergies  Allergen Reactions   Amlodipine     rash   Aspirin Other (See Comments)    Bleeding in GI tract    Outpatient Encounter Medications as of 04/24/2023  Medication Sig   albuterol (PROVENTIL HFA;VENTOLIN HFA) 108 (90 BASE) MCG/ACT inhaler Inhale 1 puff into the lungs every 6 (six) hours as needed for wheezing or shortness of breath.   Apoaequorin (PREVAGEN PO) Take 1 tablet by mouth daily.   atorvastatin (LIPITOR) 20 MG tablet Take 20 mg by mouth daily.   cholecalciferol (VITAMIN D3) 25 MCG (1000 UNIT) tablet Take 1,000 Units by mouth daily.   FARXIGA 10 MG TABS tablet  Take 10 mg by mouth daily.   Fluticasone-Salmeterol (ADVAIR) 500-50 MCG/DOSE AEPB Inhale 1 puff into the lungs 2 (two) times daily.   fluticasone-salmeterol (WIXELA INHUB) 500-50 MCG/ACT AEPB Inhale 1 puff into the lungs in the morning and at bedtime.   glucose blood test strip as directed.   hydrochlorothiazide (HYDRODIURIL) 25 MG tablet Take 25 mg by mouth daily.   Lancets (ONETOUCH ULTRASOFT) lancets as directed.   losartan (COZAAR) 100 MG tablet Take 100 mg by mouth every evening.    metFORMIN (GLUCOPHAGE) 1000 MG tablet Take 1 tablet (1,000 mg total) by mouth 2 (two) times daily with a meal.   Multiple Vitamin (MULTIVITAMIN WITH MINERALS) TABS tablet Take 1 tablet by mouth daily.   naproxen sodium (ALEVE) 220 MG tablet Take 220 mg by mouth 2 (two) times daily as needed (pain).   omeprazole (PRILOSEC) 20 MG capsule Take 20 mg by mouth daily before breakfast.   No facility-administered encounter medications on file as of 04/24/2023.    Review of Systems:  Review of Systems  Constitutional:  Negative for activity change, appetite change and fever.  HENT:  Negative for sore throat.   Eyes: Negative.   Cardiovascular:  Negative for chest pain and leg swelling.  Gastrointestinal:  Negative for abdominal distention, diarrhea and vomiting.  Genitourinary:  Negative for dysuria, frequency and urgency.  Skin:  Negative for color change.  Neurological:  Negative for dizziness and headaches.  Psychiatric/Behavioral:  Negative for behavioral problems and sleep disturbance. The patient is not nervous/anxious.     Health Maintenance  Topic Date Due   OPHTHALMOLOGY EXAM  Never done   INFLUENZA VACCINE  01/01/2023   HEMOGLOBIN A1C  08/30/2023   Diabetic kidney evaluation - Urine ACR  02/05/2024   Diabetic kidney evaluation - eGFR measurement  03/01/2024   Medicare Annual Wellness (AWV)  03/03/2024   FOOT EXAM  03/04/2024   DTaP/Tdap/Td (2 - Tdap) 12/25/2032   Pneumonia Vaccine 3+ Years old   Completed   COVID-19 Vaccine  Completed   Hepatitis C Screening  Completed   Zoster Vaccines- Shingrix  Completed   HPV VACCINES  Aged Out    Physical Exam: Vitals:   04/24/23 1525  BP: 128/88  Pulse: 85  Resp: 18  Temp: 99.1 F (37.3 C)  SpO2: 96%  Weight: 153 lb 3.2 oz (69.5 kg)  Height: 5\' 8"  (1.727 m)   Body mass index is 23.29 kg/m. Physical Exam Constitutional:      Appearance: Normal appearance.  HENT:     Head: Normocephalic and atraumatic.     Mouth/Throat:     Mouth: Mucous membranes are moist.  Eyes:     Conjunctiva/sclera: Conjunctivae normal.  Cardiovascular:     Rate and Rhythm: Normal rate and regular rhythm.     Pulses: Normal pulses.     Heart sounds: Normal heart sounds.  Pulmonary:     Effort: Pulmonary effort is normal.     Breath sounds: Normal breath sounds.  Abdominal:     General: Bowel sounds are normal.     Palpations: Abdomen is soft.  Musculoskeletal:        General: No swelling. Normal range of motion.     Cervical back: Normal range of motion.  Skin:    General: Skin is warm and dry.  Neurological:     General: No focal deficit present.     Mental Status: He is alert and oriented to person, place, and time.  Psychiatric:        Mood and Affect: Mood normal.        Behavior: Behavior normal.        Thought Content: Thought content normal.        Judgment: Judgment normal.     Labs reviewed: Basic Metabolic Panel: Recent Labs    08/22/22 0803 02/05/23 0000 03/02/23 0811  NA 138 137 140  K 4.4 5.3* 4.4  CL 106 106 106  CO2 24 21 23   GLUCOSE 150*  --  136*  BUN 40* 49* 46*  CREATININE 2.05* 2.2* 2.13*  CALCIUM 9.3 9.4 8.9  TSH 3.17  --  2.22   Liver Function Tests: Recent Labs    08/22/22 0803 02/05/23 0000 03/02/23 0811  AST 22  --  24  ALT 17  --  19  BILITOT 0.3  --  0.4  PROT 6.5  --  6.6  ALBUMIN  --  4.6  --    No results for input(s): "LIPASE", "AMYLASE" in the last 8760 hours. No results for  input(s): "AMMONIA" in the last 8760 hours. CBC: Recent Labs    08/22/22 0803 02/05/23 0000 03/02/23 0811  WBC 7.7  --  7.8  NEUTROABS 4,066  --  4,680  HGB  10.1* 9.0* 10.0*  HCT 30.4*  --  30.6*  MCV 90.7  --  90.5  PLT 198  --  194   Lipid Panel: Recent Labs    08/22/22 0803 03/02/23 0811  CHOL 109 111  HDL 37* 36*  LDLCALC 50 54  TRIG 140 120  CHOLHDL 2.9 3.1   Lab Results  Component Value Date   HGBA1C 7.6 (H) 03/02/2023    Procedures since last visit: No results found.  Assessment/Plan There are no diagnoses linked to this encounter.   Labs/tests ordered:  * No order type specified * Next appt:  09/01/2023

## 2023-04-29 ENCOUNTER — Telehealth: Payer: Self-pay

## 2023-04-29 ENCOUNTER — Other Ambulatory Visit: Payer: Self-pay | Admitting: Family

## 2023-04-29 ENCOUNTER — Other Ambulatory Visit: Payer: Self-pay | Admitting: Adult Health

## 2023-04-29 DIAGNOSIS — E1140 Type 2 diabetes mellitus with diabetic neuropathy, unspecified: Secondary | ICD-10-CM

## 2023-04-29 DIAGNOSIS — J209 Acute bronchitis, unspecified: Secondary | ICD-10-CM

## 2023-04-29 MED ORDER — DOXYCYCLINE HYCLATE 100 MG PO TABS: 100.0000 mg | ORAL_TABLET | Freq: Two times a day (BID) | ORAL | 0 refills | Status: AC

## 2023-04-29 NOTE — Telephone Encounter (Signed)
Patient was advised of message below.   Medina-Vargas, Monina C, NP  You9 minutes ago (10:42 AM)    Sent Doxycycline eRx to pharmacy.

## 2023-04-29 NOTE — Telephone Encounter (Signed)
Patient called to request a refill on Azithromycin from Monina Medina-Vargas,NP. He states a low grade fever, congestion, cough, runny nose still. Please advise

## 2023-05-13 DIAGNOSIS — K080 Exfoliation of teeth due to systemic causes: Secondary | ICD-10-CM | POA: Diagnosis not present

## 2023-08-18 DIAGNOSIS — N1832 Chronic kidney disease, stage 3b: Secondary | ICD-10-CM | POA: Diagnosis not present

## 2023-08-24 DIAGNOSIS — E7800 Pure hypercholesterolemia, unspecified: Secondary | ICD-10-CM | POA: Diagnosis not present

## 2023-08-24 DIAGNOSIS — E1122 Type 2 diabetes mellitus with diabetic chronic kidney disease: Secondary | ICD-10-CM | POA: Diagnosis not present

## 2023-08-24 DIAGNOSIS — I129 Hypertensive chronic kidney disease with stage 1 through stage 4 chronic kidney disease, or unspecified chronic kidney disease: Secondary | ICD-10-CM | POA: Diagnosis not present

## 2023-08-24 DIAGNOSIS — E1129 Type 2 diabetes mellitus with other diabetic kidney complication: Secondary | ICD-10-CM | POA: Diagnosis not present

## 2023-08-24 DIAGNOSIS — N1832 Chronic kidney disease, stage 3b: Secondary | ICD-10-CM | POA: Diagnosis not present

## 2023-09-01 ENCOUNTER — Other Ambulatory Visit: Payer: Medicare Other

## 2023-09-01 DIAGNOSIS — E1140 Type 2 diabetes mellitus with diabetic neuropathy, unspecified: Secondary | ICD-10-CM | POA: Diagnosis not present

## 2023-09-01 DIAGNOSIS — N183 Chronic kidney disease, stage 3 unspecified: Secondary | ICD-10-CM

## 2023-09-01 DIAGNOSIS — I129 Hypertensive chronic kidney disease with stage 1 through stage 4 chronic kidney disease, or unspecified chronic kidney disease: Secondary | ICD-10-CM | POA: Diagnosis not present

## 2023-09-01 DIAGNOSIS — J4520 Mild intermittent asthma, uncomplicated: Secondary | ICD-10-CM

## 2023-09-01 DIAGNOSIS — E781 Pure hyperglyceridemia: Secondary | ICD-10-CM | POA: Diagnosis not present

## 2023-09-01 DIAGNOSIS — N1832 Chronic kidney disease, stage 3b: Secondary | ICD-10-CM

## 2023-09-02 LAB — HEMOGLOBIN A1C
Hgb A1c MFr Bld: 7.9 %{Hb} — ABNORMAL HIGH (ref <5.7)
Mean Plasma Glucose: 180 mg/dL
eAG (mmol/L): 10 mmol/L

## 2023-09-02 LAB — CBC WITH DIFFERENTIAL/PLATELET
Absolute Lymphocytes: 2304 {cells}/uL (ref 850–3900)
Absolute Monocytes: 752 {cells}/uL (ref 200–950)
Basophils Absolute: 80 {cells}/uL (ref 0–200)
Basophils Relative: 1 %
Eosinophils Absolute: 360 {cells}/uL (ref 15–500)
Eosinophils Relative: 4.5 %
HCT: 31.9 % — ABNORMAL LOW (ref 38.5–50.0)
Hemoglobin: 10.7 g/dL — ABNORMAL LOW (ref 13.2–17.1)
MCH: 29.7 pg (ref 27.0–33.0)
MCHC: 33.5 g/dL (ref 32.0–36.0)
MCV: 88.6 fL (ref 80.0–100.0)
MPV: 12.2 fL (ref 7.5–12.5)
Monocytes Relative: 9.4 %
Neutro Abs: 4504 {cells}/uL (ref 1500–7800)
Neutrophils Relative %: 56.3 %
Platelets: 187 10*3/uL (ref 140–400)
RBC: 3.6 10*6/uL — ABNORMAL LOW (ref 4.20–5.80)
RDW: 12.4 % (ref 11.0–15.0)
Total Lymphocyte: 28.8 %
WBC: 8 10*3/uL (ref 3.8–10.8)

## 2023-09-02 LAB — LIPID PANEL
Cholesterol: 137 mg/dL (ref <200)
HDL: 43 mg/dL (ref >=40)
LDL Cholesterol (Calc): 70 mg/dL
Non-HDL Cholesterol (Calc): 94 mg/dL (ref <130)
Total CHOL/HDL Ratio: 3.2 (calc) (ref <5.0)
Triglycerides: 166 mg/dL — ABNORMAL HIGH (ref <150)

## 2023-09-02 LAB — COMPLETE METABOLIC PANEL WITHOUT GFR
AG Ratio: 2 (calc) (ref 1.0–2.5)
ALT: 20 U/L (ref 9–46)
AST: 27 U/L (ref 10–35)
Albumin: 4.5 g/dL (ref 3.6–5.1)
Alkaline phosphatase (APISO): 49 U/L (ref 35–144)
BUN/Creatinine Ratio: 19 (calc) (ref 6–22)
BUN: 38 mg/dL — ABNORMAL HIGH (ref 7–25)
CO2: 27 mmol/L (ref 20–32)
Calcium: 10 mg/dL (ref 8.6–10.3)
Chloride: 103 mmol/L (ref 98–110)
Creat: 2.02 mg/dL — ABNORMAL HIGH (ref 0.70–1.28)
Globulin: 2.3 g/dL (ref 1.9–3.7)
Glucose, Bld: 152 mg/dL — ABNORMAL HIGH (ref 65–99)
Potassium: 4.7 mmol/L (ref 3.5–5.3)
Sodium: 138 mmol/L (ref 135–146)
Total Bilirubin: 0.5 mg/dL (ref 0.2–1.2)
Total Protein: 6.8 g/dL (ref 6.1–8.1)

## 2023-09-02 LAB — TSH: TSH: 2.66 m[IU]/L (ref 0.40–4.50)

## 2023-09-03 ENCOUNTER — Encounter: Payer: Self-pay | Admitting: Family

## 2023-09-03 ENCOUNTER — Ambulatory Visit (INDEPENDENT_AMBULATORY_CARE_PROVIDER_SITE_OTHER): Payer: Medicare Other | Admitting: Family

## 2023-09-03 VITALS — BP 136/76 | HR 100 | Temp 98.0°F | Resp 18 | Ht 68.0 in | Wt 152.0 lb

## 2023-09-03 DIAGNOSIS — D649 Anemia, unspecified: Secondary | ICD-10-CM

## 2023-09-03 DIAGNOSIS — E781 Pure hyperglyceridemia: Secondary | ICD-10-CM | POA: Diagnosis not present

## 2023-09-03 DIAGNOSIS — I129 Hypertensive chronic kidney disease with stage 1 through stage 4 chronic kidney disease, or unspecified chronic kidney disease: Secondary | ICD-10-CM

## 2023-09-03 DIAGNOSIS — N183 Chronic kidney disease, stage 3 unspecified: Secondary | ICD-10-CM

## 2023-09-03 DIAGNOSIS — E1140 Type 2 diabetes mellitus with diabetic neuropathy, unspecified: Secondary | ICD-10-CM | POA: Diagnosis not present

## 2023-09-03 DIAGNOSIS — N63 Unspecified lump in unspecified breast: Secondary | ICD-10-CM

## 2023-09-03 DIAGNOSIS — J4520 Mild intermittent asthma, uncomplicated: Secondary | ICD-10-CM | POA: Diagnosis not present

## 2023-09-03 DIAGNOSIS — N644 Mastodynia: Secondary | ICD-10-CM

## 2023-09-03 DIAGNOSIS — N1832 Chronic kidney disease, stage 3b: Secondary | ICD-10-CM

## 2023-09-03 MED ORDER — FLUTICASONE-SALMETEROL 500-50 MCG/ACT IN AEPB: 1.0000 | INHALATION_SPRAY | Freq: Two times a day (BID) | RESPIRATORY_TRACT | 3 refills | Status: DC

## 2023-09-03 MED ORDER — METFORMIN HCL 1000 MG PO TABS: 1000.0000 mg | ORAL_TABLET | Freq: Two times a day (BID) | ORAL | 1 refills | Status: DC

## 2023-09-03 NOTE — Progress Notes (Signed)
 Provider: Richarda Blade FNP-C   Kateena Degroote, Donalee Citrin, NP  Patient Care Team: Geremy Rister, Donalee Citrin, NP as PCP - General (Family Medicine)  Extended Emergency Contact Information Primary Emergency Contact: Borland,Renee Address: [address]           [place], [place] [address]          [place], [place] [address] D[place] of [place] Phone: [phone] Relation: Daughter Secondary Emergency Contact: Luper,Beverly Address: [address]          [place], [place] [address] Darden [place] of [place] Mobile Phone: [phone] Relation: Spouse  Code Status:  Full Code  Goals of care: Advanced Directive information    09/03/2023    9:20 AM  Advanced Directives  Does Patient Have a Medical Advance Directive? No  Would patient like information on creating a medical advance directive? No - Patient declined     Chief Complaint  Patient presents with   Medical Management of Chronic Issues    6 month follow up     Discussed the use of AI scribe software for clinical note transcription with the patient, who gave verbal consent to proceed.  History of Present Illness   Jordan West is a 78 year old male with anemia and chronic kidney disease who presents for a six month follow-up.  He has a tender lump on his left breast that has been present for about a month. He is unsure if it is increasing in size but notes it is sensitive. No drainage from the area.  He continues to experience anemia, with recent lab work showing a hemoglobin level of 10.7, slightly improved from a previous level of 10. He has been taking iron pills, which have contributed to this improvement. No blood in his stool.  His chronic kidney disease remains stable, with a recent creatinine level of 2.02, slightly improved from 2.13. He acknowledges not drinking enough water, which may contribute to his condition. His BUN is 38, indicating some dehydration.  His glucose level is 150, and his A1c has increased from 7.6 to  7.9. He reports fasting blood sugars usually in the 170s, with a recent reading of 153. He attributes dietary changes, specifically eating more sandwiches, to these changes.  His cholesterol levels have increased, with total cholesterol rising from 111 to 137 and triglycerides from 120 to 166. He attributes this to dietary changes.  He is currently on multiple medications including albuterol, Wixela (fluticasone/salmeterol 500/50 mcg), losartan 100 mg daily, a multivitamin, omeprazole 20 mg before breakfast, hydrochlorothiazide 25 mg daily, atorvastatin 20 mg daily, vitamin D 1000 units daily, naproxen 220 mg twice daily as needed, Privigen, Farxiga 10 mg, metformin 1000 mg twice daily, and CoQ10 100 mg daily. He reports no issues with metformin but notes increased gas.  He reports sleeping five to six hours per night, often interrupted to check on his wife who has Alzheimer's disease. He receives some help with caregiving duties. No feelings of depression or anxiety.   Past Medical History:  Diagnosis Date   Asthma    Diabetes mellitus without complication (HCC)    GERD (gastroesophageal reflux disease)    High cholesterol    History of colonoscopy    Hx of hypercholesterolemia    PMH   Hypertension    Leg pain    Pneumonia    Past Surgical History:  Procedure Laterality Date   COLONOSCOPY     DECOMPRESSIVE LUMBAR LAMINECTOMY LEVEL 2 N/A 09/14/2013   Procedure: Lumbar  Four-Sacral One Decompression and Transforaminal Lumbar Four-Five Interbody and Lumbar Four-Sacral One Fusion;  Surgeon: Venita Lick, MD;  Location: [facility] OR;  Service: Orthopedics;  Laterality: N/A;   FRACTURE SURGERY     left pinky   LUNG SURGERY      Allergies  Allergen Reactions   Amlodipine     rash   Aspirin Other (See Comments)    Bleeding in GI tract    Allergies as of 09/03/2023       Reactions   Amlodipine    rash   Aspirin Other (See Comments)   Bleeding in GI tract        Medication List         Accurate as of September 03, 2023 10:01 PM. If you have any questions, ask your nurse or doctor.          albuterol 108 (90 Base) MCG/ACT inhaler Commonly known as: VENTOLIN HFA Inhale 1 puff into the lungs every 6 (six) hours as needed for wheezing or shortness of breath.   atorvastatin 20 MG tablet Commonly known as: LIPITOR Take 20 mg by mouth daily.   cholecalciferol 25 MCG (1000 UNIT) tablet Commonly known as: VITAMIN D3 Take 1,000 Units by mouth daily.   Farxiga 10 MG Tabs tablet Generic drug: dapagliflozin propanediol Take 10 mg by mouth daily.   fluticasone-salmeterol 500-50 MCG/ACT Aepb Commonly known as: Wixela Inhub Inhale 1 puff into the lungs in the morning and at bedtime. What changed: Another medication with the same name was removed. Continue taking this medication, and follow the directions you see here. Changed by: Donalee Citrin Larene Ascencio   glucose blood test strip as directed.   hydrochlorothiazide 25 MG tablet Commonly known as: HYDRODIURIL Take 25 mg by mouth daily.   losartan 100 MG tablet Commonly known as: COZAAR Take 100 mg by mouth every evening.   metFORMIN 1000 MG tablet Commonly known as: GLUCOPHAGE Take 1 tablet (1,000 mg total) by mouth 2 (two) times daily with a meal. What changed: See the new instructions. Changed by: Donalee Citrin Emmarose Klinke   multivitamin with minerals Tabs tablet Take 1 tablet by mouth daily.   naproxen sodium 220 MG tablet Commonly known as: ALEVE Take 220 mg by mouth 2 (two) times daily as needed (pain).   omeprazole 20 MG capsule Commonly known as: PRILOSEC Take 20 mg by mouth daily before breakfast.   onetouch ultrasoft lancets as directed.   PREVAGEN PO Take 1 tablet by mouth daily.   Q-10 CO-ENZYME PO Take 100 mg by mouth daily.        Review of Systems  Constitutional:  Negative for appetite change, chills, fatigue, fever and unexpected weight change.  HENT:  Positive for hearing loss. Negative for  congestion, dental problem, ear discharge, ear pain, facial swelling, nosebleeds, postnasal drip, rhinorrhea, sinus pressure, sinus pain, sneezing, sore throat, tinnitus and trouble swallowing.   Eyes:  Negative for pain, discharge, redness, itching and visual disturbance.  Respiratory:  Negative for cough, chest tightness, shortness of breath and wheezing.   Cardiovascular:  Negative for chest pain, palpitations and leg swelling.  Gastrointestinal:  Negative for abdominal distention, abdominal pain, blood in stool, constipation, diarrhea, nausea and vomiting.  Endocrine: Negative for cold intolerance, heat intolerance, polydipsia, polyphagia and polyuria.  Genitourinary:  Negative for difficulty urinating, dysuria, flank pain, frequency and urgency.  Musculoskeletal:  Negative for arthralgias, back pain, gait problem, joint swelling, myalgias, neck pain and neck stiffness.  Skin:  Negative for color  change, pallor, rash and wound.       Left breast sensitive   Neurological:  Negative for dizziness, syncope, speech difficulty, weakness, light-headedness, numbness and headaches.  Hematological:  Does not bruise/bleed easily.  Psychiatric/Behavioral:  Negative for agitation, behavioral problems, confusion, hallucinations, self-injury, sleep disturbance and suicidal ideas. The patient is not nervous/anxious.     Immunization History  Administered Date(s) Administered   DTaP 12/26/2022   Fluad Quad(high Dose 65+) 02/26/2022   Influenza, High Dose Seasonal PF 03/22/2023   Influenza, Seasonal, Injecte, Preservative Fre 03/24/2011   Influenza,inj,Quad PF,6+ Mos 03/18/2017, 02/17/2018, 02/09/2019, 04/10/2020   Influenza,inj,quad, With Preservative 03/29/2018   Influenza-Unspecified 06/03/1999, 04/17/2010, 03/19/2015, 05/02/2016, 03/19/2021   Moderna Covid-19 Fall Seasonal Vaccine 24yrs & older 08/22/2022, 01/10/2023   PFIZER Comirnaty(Gray Top)Covid-19 Tri-Sucrose Vaccine 09/11/2020   PFIZER(Purple  Top)SARS-COV-2 Vaccination 09/22/2019, 10/17/2019, 04/18/2020   Pfizer Covid-19 Vaccine Bivalent Booster 79yrs & up 03/19/2021, 01/02/2022   Pneumococcal Conjugate-13 06/03/2015   Pneumococcal Polysaccharide-23 04/17/2010, 08/05/2016   Pneumococcal-Unspecified 07/14/2016   Respiratory Syncytial Virus Vaccine,Recomb Aduvanted(Arexvy) 05/19/2022   Td (Adult),unspecified 01/07/2013   Tdap 10/03/2022   Zoster Recombinant(Shingrix) 10/11/2022, 12/02/2022, 12/27/2022   Pertinent  Health Maintenance Due  Topic Date Due   OPHTHALMOLOGY EXAM  Never done   INFLUENZA VACCINE  01/01/2024   HEMOGLOBIN A1C  03/02/2024   FOOT EXAM  03/04/2024      02/28/2022   11:03 AM 09/03/2022    8:46 AM 03/05/2023    9:19 AM 04/24/2023    3:24 PM 09/03/2023    9:20 AM  Fall Risk  Falls in the past year? 0 0 0 0 0  Was there an injury with Fall? 0 0  0 0  Fall Risk Category Calculator 0 0  0 0  Fall Risk Category (Retired) Low      (RETIRED) Patient Fall Risk Level Low fall risk      Patient at Risk for Falls Due to History of fall(s)   No Fall Risks History of fall(s);No Fall Risks  Fall risk Follow up Falls evaluation completed   Falls evaluation completed Falls evaluation completed   Functional Status Survey:    Vitals:   09/03/23 0925  BP: 136/76  Pulse: 100  Resp: 18  Temp: 98 F (36.7 C)  SpO2: 97%  Weight: 152 lb (68.9 kg)  Height: 5\' 8"  (1.727 m)   Body mass index is 23.11 kg/m. Physical Exam GENERAL: Alert, cooperative, well developed, no acute distress. HEENT: Normocephalic, normal oropharynx, moist mucous membranes, eardrums normal bilaterally, nose normal, no sinus tenderness. CHEST: Clear to auscultation bilaterally, no wheezes, rhonchi, or crackles. CARDIOVASCULAR: Normal heart rate and rhythm, S1 and S2 normal without murmurs. BREAST: Tender lump in left upper outer quadrant of breast. ABDOMEN: Soft, non-tender, non-distended, without organomegaly, normal bowel  sounds. EXTREMITIES: No cyanosis or edema, normal range of motion in right leg, feet normal. NEUROLOGICAL: Cranial nerves grossly intact, moves all extremities without gross motor or sensory deficit, sensation intact in feet. SKIN: No rash,no lesion or erythema.  Dry skin on heels.  PSYCHIATRY/BEHAVIORAL: Mood stable   Labs reviewed: Recent Labs    02/05/23 0000 03/02/23 0811 09/01/23 0820  NA 137 140 138  K 5.3* 4.4 4.7  CL 106 106 103  CO2 21 23 27   GLUCOSE  --  136* 152*  BUN 49* 46* 38*  CREATININE 2.2* 2.13* 2.02*  CALCIUM 9.4 8.9 10.0   Recent Labs    02/05/23 0000 03/02/23 0811 09/01/23 0820  AST  --  24 27  ALT  --  19 20  BILITOT  --  0.4 0.5  PROT  --  6.6 6.8  ALBUMIN 4.6  --   --    Recent Labs    02/05/23 0000 03/02/23 0811 09/01/23 0820  WBC  --  7.8 8.0  NEUTROABS  --  4,680 4,504  HGB 9.0* 10.0* 10.7*  HCT  --  30.6* 31.9*  MCV  --  90.5 88.6  PLT  --  194 187   Lab Results  Component Value Date   TSH 2.66 09/01/2023   Lab Results  Component Value Date   HGBA1C 7.9 (H) 09/01/2023   Lab Results  Component Value Date   CHOL 137 09/01/2023   HDL 43 09/01/2023   LDLCALC 70 09/01/2023   TRIG 166 (H) 09/01/2023   CHOLHDL 3.2 09/01/2023    Significant Diagnostic Results in last 30 days:  No results found.  Assessment/Plan Breast Lump Tender lump on the left breast, present for about a month. No drainage or erythema. Differential diagnosis includes benign breast conditions or malignancy. Further evaluation with imaging is necessary. - [facility] breast [facility] - Order mammogram of the left breast  Type 2 Diabetes Mellitus Elevated fasting blood glucose levels (153 mg/dL) and increased [id number] from 7.6% to 7.9%. Dietary changes with increased carbohydrate intake noted. He is on metformin and [place]. Fasting glucose levels in the 150s can affect eyes and kidneys. - Advise dietary modifications to reduce  carbohydrate intake - Continue metformin 1000 mg twice daily - Continue Farxiga 10 mg daily - Refill metformin prescription  Chronic Kidney Disease Chronic kidney disease with elevated creatinine levels (2.02 mg/dL, previously 9.14 mg/dL). BUN is elevated at 38 mg/dL, indicating possible dehydration. Electrolytes are stable. Encouraged to increase water intake to address dehydration. - Encourage increased water intake to address dehydration  Hypertension Blood pressure management with losartan and hydrochlorothiazide. No reported issues with current regimen. - Continue losartan 100 mg daily - Continue hydrochlorothiazide 25 mg daily  Hyperlipidemia Total cholesterol increased from 111 to 137 mg/dL. Triglycerides increased from 120 to 166 mg/dL, above the target of 782 mg/dL. HDL improved from 36 to 43 mg/dL. Increased sandwich consumption noted, contributing to elevated triglycerides and glucose levels. - Advise reducing intake of sandwiches and high-carbohydrate foods - Continue atorvastatin 20 mg daily  Anemia Chronic anemia with slight improvement in hemoglobin levels from 10.0 to 10.7 g/dL. No evidence of gastrointestinal bleeding. He is compliant with iron supplementation. - Continue iron supplementation - Monitor hemoglobin levels in six months  Asthma Asthma managed with Wixela (fluticasone/salmeterol) inhaler. No issues with current inhaler regimen. Refill needed for Wixela inhaler with 60 doses. - Refill Wixela inhaler with 60 doses  Gastroesophageal Reflux Disease (GERD) Long-term use of omeprazole 20 mg daily. He has not attempted to wean off medication as advised by previous physician. - Continue omeprazole 20 mg daily  General Health Maintenance Engages in regular physical activity, walking 3-4 miles daily. No recent eye examination, but has an appointment scheduled next week. No issues with depression or anxiety reported. - Encourage continued physical activity -  Attend scheduled eye examination  Follow-up Routine follow-up and monitoring of chronic conditions. Prescriptions to be sent to [facility] on [address]. - Schedule follow-up appointment in six months - Send prescriptions to [facility] on [address] staff Communication: Reviewed plan of care with patient verbalized understanding   Labs/tests ordered:  - CBC with Differential/Platelet -  CMP with eGFR([facility]) - TSH - Hgb A1C - Lipid panel  Next Appointment : Return in about 6 months (around 03/04/2024) for medical mangement of chronic issues., Fasting labs in 6 months prior to visit.   Spent 32 minutes of Face to face and non-face to face with patient  >50% time spent counseling; reviewing medical record; tests; labs; documentation and developing future plan of care.   Caesar Bookman, NP

## 2023-09-04 ENCOUNTER — Other Ambulatory Visit: Payer: Self-pay | Admitting: Family

## 2023-09-04 DIAGNOSIS — N644 Mastodynia: Secondary | ICD-10-CM

## 2023-09-04 DIAGNOSIS — N63 Unspecified lump in unspecified breast: Secondary | ICD-10-CM

## 2023-09-15 ENCOUNTER — Ambulatory Visit
Admission: RE | Admit: 2023-09-15 | Discharge: 2023-09-15 | Disposition: A | Discharge status: Home / Self Care | Source: Ambulatory Visit | Attending: Family | Admitting: Family

## 2023-09-15 DIAGNOSIS — N62 Hypertrophy of breast: Secondary | ICD-10-CM | POA: Diagnosis not present

## 2023-09-15 DIAGNOSIS — N63 Unspecified lump in unspecified breast: Secondary | ICD-10-CM

## 2023-09-15 DIAGNOSIS — N644 Mastodynia: Secondary | ICD-10-CM

## 2023-10-08 DIAGNOSIS — K080 Exfoliation of teeth due to systemic causes: Secondary | ICD-10-CM | POA: Diagnosis not present

## 2023-10-29 DIAGNOSIS — Z961 Presence of intraocular lens: Secondary | ICD-10-CM | POA: Diagnosis not present

## 2023-10-29 DIAGNOSIS — H40013 Open angle with borderline findings, low risk, bilateral: Secondary | ICD-10-CM | POA: Diagnosis not present

## 2023-10-29 DIAGNOSIS — H26493 Other secondary cataract, bilateral: Secondary | ICD-10-CM | POA: Diagnosis not present

## 2023-10-29 DIAGNOSIS — E119 Type 2 diabetes mellitus without complications: Secondary | ICD-10-CM | POA: Diagnosis not present

## 2023-10-29 DIAGNOSIS — H0102A Squamous blepharitis right eye, upper and lower eyelids: Secondary | ICD-10-CM | POA: Diagnosis not present

## 2023-10-29 LAB — HM DIABETES EYE EXAM

## 2023-11-19 DIAGNOSIS — J4489 Other specified chronic obstructive pulmonary disease: Secondary | ICD-10-CM | POA: Diagnosis not present

## 2023-12-14 MED ORDER — ATORVASTATIN CALCIUM 20 MG PO TABS: 20.0000 mg | ORAL_TABLET | Freq: Every day | ORAL | 1 refills | Status: DC

## 2023-12-14 MED ORDER — HYDROCHLOROTHIAZIDE 25 MG PO TABS: 25.0000 mg | ORAL_TABLET | Freq: Every day | ORAL | 1 refills | Status: DC

## 2023-12-14 MED ORDER — OMEPRAZOLE 20 MG PO CPDR: 20.0000 mg | DELAYED_RELEASE_CAPSULE | Freq: Every day | ORAL | 1 refills | Status: DC

## 2024-01-05 ENCOUNTER — Other Ambulatory Visit: Payer: Self-pay | Admitting: Family

## 2024-01-05 DIAGNOSIS — J4520 Mild intermittent asthma, uncomplicated: Secondary | ICD-10-CM

## 2024-01-19 ENCOUNTER — Other Ambulatory Visit: Payer: Self-pay | Admitting: Family

## 2024-01-19 DIAGNOSIS — N183 Chronic kidney disease, stage 3 unspecified: Secondary | ICD-10-CM

## 2024-01-19 MED ORDER — HYDRALAZINE HCL 100 MG PO TABS: 100.0000 mg | ORAL_TABLET | Freq: Two times a day (BID) | ORAL | 1 refills | Status: DC

## 2024-01-19 NOTE — Telephone Encounter (Signed)
Message routed to PCP Ngetich, Dinah C, NP  

## 2024-02-02 DIAGNOSIS — N1832 Chronic kidney disease, stage 3b: Secondary | ICD-10-CM | POA: Diagnosis not present

## 2024-02-11 DIAGNOSIS — I129 Hypertensive chronic kidney disease with stage 1 through stage 4 chronic kidney disease, or unspecified chronic kidney disease: Secondary | ICD-10-CM | POA: Diagnosis not present

## 2024-02-11 DIAGNOSIS — D509 Iron deficiency anemia, unspecified: Secondary | ICD-10-CM | POA: Diagnosis not present

## 2024-02-11 DIAGNOSIS — E1122 Type 2 diabetes mellitus with diabetic chronic kidney disease: Secondary | ICD-10-CM | POA: Diagnosis not present

## 2024-02-11 DIAGNOSIS — N1832 Chronic kidney disease, stage 3b: Secondary | ICD-10-CM | POA: Diagnosis not present

## 2024-02-28 ENCOUNTER — Other Ambulatory Visit: Payer: Self-pay | Admitting: Family

## 2024-02-28 DIAGNOSIS — E1140 Type 2 diabetes mellitus with diabetic neuropathy, unspecified: Secondary | ICD-10-CM

## 2024-03-02 ENCOUNTER — Other Ambulatory Visit

## 2024-03-02 DIAGNOSIS — I129 Hypertensive chronic kidney disease with stage 1 through stage 4 chronic kidney disease, or unspecified chronic kidney disease: Secondary | ICD-10-CM | POA: Diagnosis not present

## 2024-03-02 DIAGNOSIS — E1140 Type 2 diabetes mellitus with diabetic neuropathy, unspecified: Secondary | ICD-10-CM

## 2024-03-02 DIAGNOSIS — D649 Anemia, unspecified: Secondary | ICD-10-CM

## 2024-03-02 DIAGNOSIS — N1832 Chronic kidney disease, stage 3b: Secondary | ICD-10-CM

## 2024-03-02 DIAGNOSIS — E781 Pure hyperglyceridemia: Secondary | ICD-10-CM | POA: Diagnosis not present

## 2024-03-02 DIAGNOSIS — J4520 Mild intermittent asthma, uncomplicated: Secondary | ICD-10-CM

## 2024-03-03 LAB — LIPID PANEL
Cholesterol: 123 mg/dL (ref <200)
HDL: 38 mg/dL — ABNORMAL LOW (ref >=40)
LDL Cholesterol (Calc): 64 mg/dL
Non-HDL Cholesterol (Calc): 85 mg/dL (ref <130)
Total CHOL/HDL Ratio: 3.2 (calc) (ref <5.0)
Triglycerides: 133 mg/dL (ref <150)

## 2024-03-03 LAB — CBC WITH DIFFERENTIAL/PLATELET
Absolute Lymphocytes: 2293 {cells}/uL (ref 850–3900)
Absolute Monocytes: 718 {cells}/uL (ref 200–950)
Basophils Absolute: 94 {cells}/uL (ref 0–200)
Basophils Relative: 1.2 %
Eosinophils Absolute: 281 {cells}/uL (ref 15–500)
Eosinophils Relative: 3.6 %
HCT: 31.8 % — ABNORMAL LOW (ref 38.5–50.0)
Hemoglobin: 10.4 g/dL — ABNORMAL LOW (ref 13.2–17.1)
MCH: 29.5 pg (ref 27.0–33.0)
MCHC: 32.7 g/dL (ref 32.0–36.0)
MCV: 90.1 fL (ref 80.0–100.0)
MPV: 12.1 fL (ref 7.5–12.5)
Monocytes Relative: 9.2 %
Neutro Abs: 4415 {cells}/uL (ref 1500–7800)
Neutrophils Relative %: 56.6 %
Platelets: 177 Thousand/uL (ref 140–400)
RBC: 3.53 Million/uL — ABNORMAL LOW (ref 4.20–5.80)
RDW: 12.6 % (ref 11.0–15.0)
Total Lymphocyte: 29.4 %
WBC: 7.8 Thousand/uL (ref 3.8–10.8)

## 2024-03-03 LAB — COMPREHENSIVE METABOLIC PANEL WITH GFR
AG Ratio: 1.8 (calc) (ref 1.0–2.5)
ALT: 20 U/L (ref 9–46)
AST: 25 U/L (ref 10–35)
Albumin: 4.3 g/dL (ref 3.6–5.1)
Alkaline phosphatase (APISO): 48 U/L (ref 35–144)
BUN/Creatinine Ratio: 19 (calc) (ref 6–22)
BUN: 35 mg/dL — ABNORMAL HIGH (ref 7–25)
CO2: 27 mmol/L (ref 20–32)
Calcium: 9.6 mg/dL (ref 8.6–10.3)
Chloride: 103 mmol/L (ref 98–110)
Creat: 1.87 mg/dL — ABNORMAL HIGH (ref 0.70–1.28)
Globulin: 2.4 g/dL (ref 1.9–3.7)
Glucose, Bld: 158 mg/dL — ABNORMAL HIGH (ref 65–99)
Potassium: 4.5 mmol/L (ref 3.5–5.3)
Sodium: 139 mmol/L (ref 135–146)
Total Bilirubin: 0.5 mg/dL (ref 0.2–1.2)
Total Protein: 6.7 g/dL (ref 6.1–8.1)
eGFR: 36 mL/min/1.73m2 — ABNORMAL LOW (ref >=60)

## 2024-03-03 LAB — TSH: TSH: 2.78 m[IU]/L (ref 0.40–4.50)

## 2024-03-03 LAB — HEMOGLOBIN A1C
Hgb A1c MFr Bld: 7.7 % — ABNORMAL HIGH (ref <5.7)
Mean Plasma Glucose: 174 mg/dL
eAG (mmol/L): 9.7 mmol/L

## 2024-03-04 ENCOUNTER — Ambulatory Visit: Payer: Self-pay | Admitting: Family

## 2024-03-07 ENCOUNTER — Ambulatory Visit: Admitting: Family

## 2024-03-07 ENCOUNTER — Encounter: Payer: Self-pay | Admitting: Family

## 2024-03-07 VITALS — BP 128/74 | HR 93 | Temp 98.0°F | Resp 19 | Ht 68.0 in | Wt 154.0 lb

## 2024-03-07 DIAGNOSIS — Z636 Dependent relative needing care at home: Secondary | ICD-10-CM

## 2024-03-07 DIAGNOSIS — J4520 Mild intermittent asthma, uncomplicated: Secondary | ICD-10-CM | POA: Diagnosis not present

## 2024-03-07 DIAGNOSIS — E1140 Type 2 diabetes mellitus with diabetic neuropathy, unspecified: Secondary | ICD-10-CM | POA: Diagnosis not present

## 2024-03-07 DIAGNOSIS — I129 Hypertensive chronic kidney disease with stage 1 through stage 4 chronic kidney disease, or unspecified chronic kidney disease: Secondary | ICD-10-CM

## 2024-03-07 DIAGNOSIS — E781 Pure hyperglyceridemia: Secondary | ICD-10-CM

## 2024-03-07 DIAGNOSIS — K219 Gastro-esophageal reflux disease without esophagitis: Secondary | ICD-10-CM

## 2024-03-07 DIAGNOSIS — N183 Chronic kidney disease, stage 3 unspecified: Secondary | ICD-10-CM | POA: Diagnosis not present

## 2024-03-07 DIAGNOSIS — D649 Anemia, unspecified: Secondary | ICD-10-CM

## 2024-03-07 DIAGNOSIS — B351 Tinea unguium: Secondary | ICD-10-CM

## 2024-03-07 MED ORDER — OMEPRAZOLE 20 MG PO CPDR: 20.0000 mg | DELAYED_RELEASE_CAPSULE | Freq: Every day | ORAL | 1 refills | Status: AC

## 2024-03-07 MED ORDER — HYDROCHLOROTHIAZIDE 25 MG PO TABS
25.0000 mg | ORAL_TABLET | Freq: Every day | ORAL | 1 refills | Status: AC
Start: 2024-03-07 — End: ?

## 2024-03-07 MED ORDER — HYDRALAZINE HCL 100 MG PO TABS: 100.0000 mg | ORAL_TABLET | Freq: Two times a day (BID) | ORAL | 1 refills | Status: AC

## 2024-03-07 MED ORDER — CICLOPIROX 8 % EX SOLN: Freq: Every day | CUTANEOUS | 0 refills | Status: AC

## 2024-03-07 MED ORDER — ATORVASTATIN CALCIUM 20 MG PO TABS: 20.0000 mg | ORAL_TABLET | Freq: Every day | ORAL | 1 refills | Status: AC

## 2024-03-07 MED ORDER — FERROUS SULFATE 325 (65 FE) MG PO TABS: 325.0000 mg | ORAL_TABLET | Freq: Two times a day (BID) | ORAL | 1 refills | Status: AC

## 2024-03-07 MED ORDER — LOSARTAN POTASSIUM 100 MG PO TABS: 100.0000 mg | ORAL_TABLET | Freq: Every evening | ORAL | 1 refills | Status: AC

## 2024-03-07 NOTE — Patient Instructions (Signed)
 1.Go to local pharmacy to receive Covid vaccine. 2.Stop at check out and schedule Annual wellness visit.

## 2024-03-08 LAB — MICROALBUMIN / CREATININE URINE RATIO
Creatinine, Urine: 41 mg/dL (ref 20–320)
Microalb Creat Ratio: 180 mg/g{creat} — ABNORMAL HIGH (ref <30)
Microalb, Ur: 7.4 mg/dL

## 2024-03-10 ENCOUNTER — Ambulatory Visit: Payer: Self-pay | Admitting: Family

## 2024-03-18 DIAGNOSIS — E1140 Type 2 diabetes mellitus with diabetic neuropathy, unspecified: Secondary | ICD-10-CM | POA: Insufficient documentation

## 2024-03-18 NOTE — Progress Notes (Signed)
 Provider: Roxan Plough FNP-C   Rhiley Solem, Roxan BROCKS, NP  Patient Care Team: [place] Capers, Roxan BROCKS, NP as PCP - General (Family Medicine)  Extended Emergency Contact Information Primary Emergency Contact: Brune,Renee Address: [address]           [place], [place] [address]          [place], [place] [address] [place] Mobile Phone: [phone] Relation: Daughter Secondary Emergency Contact: Spieler,Beverly Address: [address]          [place], [place] [address] [place] Mobile Phone: [phone] Relation: Spouse  Code Status: Full code Goals of care: Advanced Directive information    03/07/2024    9:27 AM  Advanced Directives  Does Patient Have a Medical Advance Directive? Yes  Type of Estate agent of [place];Living will  Does patient want to make changes to medical advance directive? No - Patient declined  Copy of Healthcare Power of Attorney in Chart? Yes - validated most recent copy scanned in chart (See row information)     Chief Complaint  Patient presents with   Medical Management of Chronic Issues    6 Month follow up.      Discussed the use of AI scribe software for clinical note transcription with the patient, who gave verbal consent to proceed.  History of Present Illness   Jordan West is a 78 year old male with diabetes and chronic kidney disease who presents for a six-month follow-up.  His hemoglobin A1c has improved from 7.9% six months ago to 7.7% currently. He is on metformin  1000 mg daily. His glucose level was noted to be 158 mg/dL, which is higher than the desired level of below 100 mg/dL.  He has chronic kidney disease, and his creatinine level has improved from 2.13 mg/dL to 8.12 mg/dL. He is on losartan  100 mg daily.  He reports anemia with a hemoglobin level of 10.4 g/dL, slightly decreased from 10.7 g/dL previously. He takes one iron pill daily. No blood in stool or hemorrhoids.  His  cholesterol levels are generally good, with total cholesterol at 123 mg/dL and LDL at 64 mg/dL. However, his HDL is slightly low at 38 mg/dL. He walks 3 to 5 miles daily but does not engage in fast walking due to accompanying his wife.  He has a history of asthma but has not needed to use his albuterol  inhaler recently and reports no wheezing. He uses Advair one puff twice daily.  He is a caregiver for his wife, who has Alzheimer's, and reports not getting enough sleep due to caregiving responsibilities. No anxiety or depression despite these challenges.  He is on multiple medications including atorvastatin  20 mg, hydrochlorothiazide  25 mg, vitamin D 1000 units daily, and omeprazole  for acid reflux. He uses naproxen as needed for pain but prefers to endure the pain to avoid kidney complications.  No alcohol use, recent falls, anxiety, depression, numbness, tingling, or significant changes in vision or dental health. He manages his toenail care himself.   Past Medical History:  Diagnosis Date   Asthma    Diabetes mellitus without complication (HCC)    GERD (gastroesophageal reflux disease)    High cholesterol    History of colonoscopy    Hx of hypercholesterolemia    PMH   Hypertension    Leg pain    Pneumonia    Past Surgical History:  Procedure Laterality Date   COLONOSCOPY     DECOMPRESSIVE LUMBAR LAMINECTOMY LEVEL 2  N/A 09/14/2013   Procedure: Lumbar Four-Sacral One Decompression and Transforaminal Lumbar Four-Five Interbody and Lumbar Four-Sacral One Fusion;  Surgeon: Donaciano Sprang, MD;  Location: [facility] OR;  Service: Orthopedics;  Laterality: N/A;   FRACTURE SURGERY     left pinky   LUNG SURGERY      Allergies  Allergen Reactions   Amlodipine     rash   Aspirin Other (See Comments)    Bleeding in GI tract    Allergies as of 03/07/2024       Reactions   Amlodipine    rash   Aspirin Other (See Comments)   Bleeding in GI tract        Medication List        Accurate  as of March 07, 2024 11:59 PM. If you have any questions, ask your nurse or doctor.          albuterol  108 (90 Base) MCG/ACT inhaler Commonly known as: VENTOLIN  HFA Inhale 1 puff into the lungs every 6 (six) hours as needed for wheezing or shortness of breath.   atorvastatin  20 MG tablet Commonly known as: LIPITOR Take 1 tablet (20 mg total) by mouth daily.   cholecalciferol 25 MCG (1000 UNIT) tablet Commonly known as: VITAMIN D3 Take 1,000 Units by mouth daily.   ciclopirox 8 % solution Commonly known as: PENLAC Apply topically at bedtime. Apply over nail and surrounding skin. Apply daily over previous coat. After seven (7) days, may remove with alcohol and continue cycle. Started by: Lainey Nelson C Lylian Sanagustin   Farxiga 10 MG Tabs tablet Generic drug: dapagliflozin propanediol Take 10 mg by mouth daily.   ferrous sulfate 325 (65 FE) MG tablet Take 1 tablet (325 mg total) by mouth 2 (two) times daily with a meal. Started by: Anjelita Sheahan C Connor Meacham   fluticasone -salmeterol 500-50 MCG/ACT Aepb Commonly known as: ADVAIR INHALE 1 PUFF INTO THE LUNGS IN THE MORNING AND AT BEDTIME   glucose blood test strip as directed.   hydrALAZINE  100 MG tablet Commonly known as: APRESOLINE  Take 1 tablet (100 mg total) by mouth 2 (two) times daily.   hydrochlorothiazide  25 MG tablet Commonly known as: HYDRODIURIL  Take 1 tablet (25 mg total) by mouth daily.   losartan  100 MG tablet Commonly known as: COZAAR  Take 1 tablet (100 mg total) by mouth every evening.   metFORMIN  1000 MG tablet Commonly known as: GLUCOPHAGE  TAKE 1 TABLET(1000 MG) BY MOUTH TWICE DAILY WITH A MEAL   multivitamin with minerals Tabs tablet Take 1 tablet by mouth daily.   naproxen sodium 220 MG tablet Commonly known as: ALEVE Take 220 mg by mouth 2 (two) times daily as needed (pain).   omeprazole  20 MG capsule Commonly known as: PRILOSEC Take 1 capsule (20 mg total) by mouth daily before breakfast.   onetouch ultrasoft  lancets as directed.   PREVAGEN PO Take 1 tablet by mouth daily.   Q-10 CO-ENZYME PO Take 100 mg by mouth daily.        Review of Systems  Constitutional:  Negative for appetite change, chills, fatigue, fever and unexpected weight change.  HENT:  Negative for congestion, dental problem, ear discharge, ear pain, facial swelling, hearing loss, nosebleeds, postnasal drip, rhinorrhea, sinus pressure, sinus pain, sneezing, sore throat, tinnitus and trouble swallowing.   Eyes:  Negative for pain, discharge, redness, itching and visual disturbance.  Respiratory:  Negative for cough, chest tightness, shortness of breath and wheezing.   Cardiovascular:  Negative for chest pain, palpitations and leg swelling.  Gastrointestinal:  Negative for abdominal distention, abdominal pain, blood in stool, constipation, diarrhea, nausea and vomiting.  Endocrine: Negative for cold intolerance, heat intolerance, polydipsia, polyphagia and polyuria.  Genitourinary:  Negative for difficulty urinating, dysuria, flank pain, frequency and urgency.  Musculoskeletal:  Negative for arthralgias, back pain, gait problem, joint swelling, myalgias, neck pain and neck stiffness.  Skin:  Negative for color change, pallor, rash and wound.  Neurological:  Negative for dizziness, syncope, speech difficulty, weakness, light-headedness, numbness and headaches.  Hematological:  Does not bruise/bleed easily.  Psychiatric/Behavioral:  Negative for agitation, behavioral problems, confusion, hallucinations, self-injury, sleep disturbance and suicidal ideas. The patient is not nervous/anxious.        Increased stress due to caring for wife who has dementia     Immunization History  Administered Date(s) Administered   DTaP 12/26/2022   Fluad Quad(high Dose 65+) 02/26/2022   INFLUENZA, HIGH DOSE SEASONAL PF 03/22/2023   Influenza, Seasonal, Injecte, Preservative Fre 03/24/2011   Influenza,inj,Quad PF,6+ Mos 03/18/2017, 02/17/2018,  02/09/2019, 04/10/2020   Influenza,inj,quad, With Preservative 03/29/2018   Influenza-Unspecified 06/03/1999, 04/17/2010, 03/19/2015, 05/02/2016, 03/19/2021, 03/06/2024   Moderna Covid-19 Fall Seasonal Vaccine 69yrs & older 08/22/2022, 01/10/2023   PFIZER Comirnaty(Gray Top)Covid-19 Tri-Sucrose Vaccine 09/11/2020   PFIZER(Purple Top)SARS-COV-2 Vaccination 09/22/2019, 10/17/2019, 04/18/2020   Pfizer Covid-19 Vaccine Bivalent Booster 42yrs & up 03/19/2021, 01/02/2022   Pneumococcal Conjugate-13 06/03/2015   Pneumococcal Polysaccharide-23 04/17/2010, 08/05/2016   Pneumococcal-Unspecified 07/14/2016   Respiratory Syncytial Virus Vaccine,Recomb Aduvanted(Arexvy) 05/19/2022   Td (Adult),unspecified 01/07/2013   Tdap 10/03/2022   Zoster Recombinant(Shingrix) 10/11/2022, 12/02/2022, 12/27/2022   Pertinent  Health Maintenance Due  Topic Date Due   FOOT EXAM  03/04/2024   HEMOGLOBIN A1C  08/31/2024   OPHTHALMOLOGY EXAM  10/28/2024   Influenza Vaccine  Completed      09/03/2022    8:46 AM 03/05/2023    9:19 AM 04/24/2023    3:24 PM 09/03/2023    9:20 AM 03/07/2024    9:27 AM  Fall Risk  Falls in the past year? 0 0 0 0 0  Was there an injury with Fall? 0  0 0 0  Fall Risk Category Calculator 0  0 0 0  Patient at Risk for Falls Due to   No Fall Risks History of fall(s);No Fall Risks No Fall Risks  Fall risk Follow up   Falls evaluation completed Falls evaluation completed Falls evaluation completed   Functional Status Survey:    Vitals:   03/07/24 0931  BP: 128/74  Pulse: 93  Resp: 19  Temp: 98 F (36.7 C)  SpO2: 97%  Weight: 154 lb (69.9 kg)  Height: 5' 8 (1.727 m)   Body mass index is 23.42 kg/m. Physical Exam  VITALS: T- 98.0, P- 93, BP- 128/74, SaO2- 99% GENERAL: Alert, cooperative, well developed, no acute distress. HEENT: Normocephalic, normal oropharynx, moist mucous membranes, ears normal with no signs of infection, nose normal, no sinus tenderness. NECK: No  lymphadenopathy. CHEST: Clear to auscultation bilaterally, no wheezes, rhonchi, or crackles. CARDIOVASCULAR: Normal heart rate and rhythm, S1 and S2 normal without murmurs. ABDOMEN: Soft, non-tender, non-distended, without organomegaly, normal bowel sounds. EXTREMITIES: No cyanosis or edema. NEUROLOGICAL: Cranial nerves grossly intact, moves all extremities without gross motor or sensory deficit, sensation intact bilaterally.      Labs reviewed: Recent Labs    09/01/23 0820 03/02/24 0807  NA 138 139  K 4.7 4.5  CL 103 103  CO2 27 27  GLUCOSE 152* 158*  BUN 38* 35*  CREATININE 2.02* 1.87*  CALCIUM  10.0 9.6   Recent Labs    09/01/23 0820 03/02/24 0807  AST 27 25  ALT 20 20  BILITOT 0.5 0.5  PROT 6.8 6.7   Recent Labs    09/01/23 0820 03/02/24 0807  WBC 8.0 7.8  NEUTROABS 4,504 4,415  HGB 10.7* 10.4*  HCT 31.9* 31.8*  MCV 88.6 90.1  PLT 187 177   Lab Results  Component Value Date   TSH 2.78 03/02/2024   Lab Results  Component Value Date   HGBA1C 7.7 (H) 03/02/2024   Lab Results  Component Value Date   CHOL 123 03/02/2024   HDL 38 (L) 03/02/2024   LDLCALC 64 03/02/2024   TRIG 133 03/02/2024   CHOLHDL 3.2 03/02/2024    Significant Diagnostic Results in last 30 days:  No results found.  Assessment/Plan  Type 2 diabetes mellitus with diabetic neuropathy A1c decreased from 7.9 to 7.7, indicating better control. Glucose level is 158, higher than desired but trending positively. Continued management with metformin  is effective. - Continue metformin  1000 mg - Encourage exercise to help lower glucose levels - Advise reducing intake of sweets, cakes, cookies, and juices  Hypertensive chronic kidney disease Creatinine improved from 2.13 to 1.87. Blood pressure is well-controlled at 128/74. Losartan  is effective in managing blood pressure and protecting kidney function. - Continue losartan  100 mg - Order urine specimen to check for  proteinuria  Anemia Anemia persists with hemoglobin at 10.4, slightly decreased from 10.7. Likely related to chronic kidney disease affecting erythropoiesis. No signs of gastrointestinal bleeding or hemorrhoids. Iron supplementation is ongoing. - Increase iron supplementation to two pills daily with a stool softener to prevent constipation - Encourage consumption of iron-[place]berg foods such as dark green vegetables, spinach, collard greens, and beets - Check if stool specimen was collected; if not, collect stool specimen to check for blood loss  Pure hyperglyceridemia Triglycerides have decreased with dietary changes and exercise. Total cholesterol is excellent at 123. HDL is slightly low at 38, but LDL is well-controlled at 64. - Encourage continued exercise to increase HDL - Advise dietary modifications to reduce carbohydrates such as bread, starchy foods, white rice, and pasta  Mild intermittent asthma No recent use of albuterol  and no wheezing reported.  Gastroesophageal reflux disease Gastroesophageal reflux disease is managed with omeprazole , which he continues to require. - Continue omeprazole  as needed  Fungal infection of toenail Fungal infection of the toenail is present, characterized by yellow discoloration. - Try over-the-counter antifungal treatment for toenail - Apply treatment for seven days, then wash off and repeat cycle  Caregiver stress He is experiencing caregiver stress due to caring for his wife with Alzheimer's, affecting his sleep and potentially his heart rate. - Consider discussing support options such as a CNA for assistance   Family/ staff Communication: Reviewed plan of care with patient Levonest understanding  Labs/tests ordered:  - CBC with Differential/Platelet - CMP with eGFR([facility]) - TSH - Hgb A1C - Lipid panel - Urine microalbumin creatinine ratio  Next Appointment : Return in about 6 months (around 09/05/2024) for medical mangement of chronic  issues., Fasting labs in 6 months prior to visit.   Spent 30 minutes of Face to face and non-face to face with patient  >50% time spent counseling; reviewing medical record; tests; labs; documentation and developing future plan of care.   Roxan JAYSON Plough, NP

## 2024-03-24 ENCOUNTER — Encounter: Payer: Self-pay | Admitting: Family

## 2024-03-24 ENCOUNTER — Ambulatory Visit: Admitting: Family

## 2024-03-24 VITALS — BP 126/78 | HR 98 | Temp 97.8°F | Resp 19 | Ht 68.0 in | Wt 151.2 lb

## 2024-03-24 DIAGNOSIS — Z0000 Encounter for general adult medical examination without abnormal findings: Secondary | ICD-10-CM | POA: Diagnosis not present

## 2024-03-24 NOTE — Patient Instructions (Signed)
 [name] , Thank you for taking time to come for your Medicare Wellness Visit. I appreciate your ongoing commitment to your health goals. Please review the following plan we discussed and let me know if I can assist you in the future.   Screening recommendations/referrals: Colonoscopy : N/A  Recommended yearly ophthalmology/optometry visit for glaucoma screening and checkup Recommended yearly dental visit for hygiene and checkup  Vaccinations: Influenza vaccine due annually in September/October Pneumococcal vaccine : Up to date  Tdap vaccine : Up to date  Shingles vaccine : Up to date     Advanced directives: yes   Conditions/risks identified:  advanced age (>7men, >53 women);hypertension;male gender;diabetes mellitus  Next appointment: 1 year   Preventive Care 13 Years and Older, Male Preventive care refers to lifestyle choices and visits with your health care provider that can promote health and wellness. What does preventive care include? A yearly physical exam. This is also called an annual well check. Dental exams once or twice a year. Routine eye exams. Ask your health care provider how often you should have your eyes checked. Personal lifestyle choices, including: Daily care of your teeth and gums. Regular physical activity. Eating a healthy diet. Avoiding tobacco and drug use. Limiting alcohol use. Practicing safe sex. Taking low doses of aspirin every day. Taking vitamin and mineral supplements as recommended by your health care provider. What happens during an annual well check? The services and screenings done by your health care provider during your annual well check will depend on your age, overall health, lifestyle risk factors, and family history of disease. Counseling  Your health care provider may ask you questions about your: Alcohol use. Tobacco use. Drug use. Emotional well-being. Home and relationship well-being. Sexual activity. Eating  habits. History of falls. Memory and ability to understand (cognition). Work and work Astronomer. Screening  You may have the following tests or measurements: Height, weight, and BMI. Blood pressure. Lipid and cholesterol levels. These may be checked every 5 years, or more frequently if you are over 51 years old. Skin check. Lung cancer screening. You may have this screening every year starting at age 19 if you have a 30-pack-year history of smoking and currently smoke or have quit within the past 15 years. Fecal occult blood test (FOBT) of the stool. You may have this test every year starting at age 34. Flexible sigmoidoscopy or colonoscopy. You may have a sigmoidoscopy every 5 years or a colonoscopy every 10 years starting at age 67. Prostate cancer screening. Recommendations will vary depending on your family history and other risks. Hepatitis C blood test. Hepatitis B blood test. Sexually transmitted disease (STD) testing. Diabetes screening. This is done by checking your blood sugar (glucose) after you have not eaten for a while (fasting). You may have this done every 1-3 years. Abdominal aortic aneurysm (AAA) screening. You may need this if you are a current or former smoker. Osteoporosis. You may be screened starting at age 54 if you are at high risk. Talk with your health care provider about your test results, treatment options, and if necessary, the need for more tests. Vaccines  Your health care provider may recommend certain vaccines, such as: Influenza vaccine. This is recommended every year. Tetanus, diphtheria, and acellular pertussis (Tdap, Td) vaccine. You may need a Td booster every 10 years. Zoster vaccine. You may need this after age 10. Pneumococcal 13-valent conjugate (PCV13) vaccine. One dose is recommended after age 22. Pneumococcal polysaccharide (PPSV23) vaccine. One dose is recommended  after age 44. Talk to your health care provider about which screenings and  vaccines you need and how often you need them. This information is not intended to replace advice given to you by your health care provider. Make sure you discuss any questions you have with your health care provider. Document Released: 06/15/2015 Document Revised: 02/06/2016 Document Reviewed: 03/20/2015 Elsevier Interactive Patient Education  2017 [employer].  Fall Prevention in the Home Falls can cause injuries. They can happen to people of all ages. There are many things you can do to make your home safe and to help prevent falls. What can I do on the outside of my home? Regularly fix the edges of walkways and driveways and fix any cracks. Remove anything that might make you trip as you walk through a door, such as a raised step or threshold. Trim any bushes or trees on the path to your home. Use bright outdoor lighting. Clear any walking paths of anything that might make someone trip, such as rocks or tools. Regularly check to see if handrails are loose or broken. Make sure that both sides of any steps have handrails. Any raised decks and porches should have guardrails on the edges. Have any leaves, snow, or ice cleared regularly. Use sand or salt on walking paths during winter. Clean up any spills in your garage right away. This includes oil or grease spills. What can I do in the bathroom? Use night lights. Install grab bars by the toilet and in the tub and shower. Do not use towel bars as grab bars. Use non-skid mats or decals in the tub or shower. If you need to sit down in the shower, use a plastic, non-slip stool. Keep the floor dry. Clean up any water that spills on the floor as soon as it happens. Remove soap buildup in the tub or shower regularly. Attach bath mats securely with double-sided non-slip rug tape. Do not have throw rugs and other things on the floor that can make you trip. What can I do in the bedroom? Use night lights. Make sure that you have a light by your  bed that is easy to reach. Do not use any sheets or blankets that are too big for your bed. They should not hang down onto the floor. Have a firm chair that has side arms. You can use this for support while you get dressed. Do not have throw rugs and other things on the floor that can make you trip. What can I do in the kitchen? Clean up any spills right away. Avoid walking on wet floors. Keep items that you use a lot in easy-to-reach places. If you need to reach something above you, use a strong step stool that has a grab bar. Keep electrical cords out of the way. Do not use floor polish or wax that makes floors slippery. If you must use wax, use non-skid floor wax. Do not have throw rugs and other things on the floor that can make you trip. What can I do with my stairs? Do not leave any items on the stairs. Make sure that there are handrails on both sides of the stairs and use them. Fix handrails that are broken or loose. Make sure that handrails are as long as the stairways. Check any carpeting to make sure that it is firmly attached to the stairs. Fix any carpet that is loose or worn. Avoid having throw rugs at the top or bottom of the stairs. If you  do have throw rugs, attach them to the floor with carpet tape. Make sure that you have a light switch at the top of the stairs and the bottom of the stairs. If you do not have them, ask someone to add them for you. What else can I do to help prevent falls? Wear shoes that: Do not have high heels. Have rubber bottoms. Are comfortable and fit you well. Are closed at the toe. Do not wear sandals. If you use a stepladder: Make sure that it is fully opened. Do not climb a closed stepladder. Make sure that both sides of the stepladder are locked into place. Ask someone to hold it for you, if possible. Clearly mark and make sure that you can see: Any grab bars or handrails. First and last steps. Where the edge of each step is. Use tools that  help you move around (mobility aids) if they are needed. These include: Canes. Walkers. Scooters. Crutches. Turn on the lights when you go into a dark area. Replace any light bulbs as soon as they burn out. Set up your furniture so you have a clear path. Avoid moving your furniture around. If any of your floors are uneven, fix them. If there are any pets around you, be aware of where they are. Review your medicines with your doctor. Some medicines can make you feel dizzy. This can increase your chance of falling. Ask your doctor what other things that you can do to help prevent falls. This information is not intended to replace advice given to you by your health care provider. Make sure you discuss any questions you have with your health care provider. Document Released: 03/15/2009 Document Revised: 10/25/2015 Document Reviewed: 06/23/2014 Elsevier Interactive Patient Education  2017 [employer].

## 2024-03-24 NOTE — Progress Notes (Signed)
 Subjective:   Jordan West is a 78 y.o. male who presents for Medicare Annual/Subsequent preventive examination.  Visit Complete: In person  Patient Medicare AWV questionnaire was completed by the patient on 03/24/2024; I have confirmed that all information answered by patient is correct and no changes since this date.  Cardiac Risk Factors include: advanced age (>60men, >63 women);hypertension;male gender;diabetes mellitus     Objective:    Today's Vitals   03/24/24 0924 03/24/24 0937  BP: 126/78   Pulse: 98   Resp: 19   Temp: 97.8 F (36.6 C)   SpO2: 99%   Weight: 151 lb 3.2 oz (68.6 kg)   Height: 5' 8 (1.727 m)   PainSc:  4    Body mass index is 22.99 kg/m.     03/24/2024    9:13 AM 03/07/2024    9:27 AM 09/03/2023    9:20 AM 03/05/2023    9:19 AM 03/04/2023    8:30 AM 09/03/2022    8:46 AM 02/26/2022   10:07 AM  Advanced Directives  Does Patient Have a Medical Advance Directive? Yes Yes No No No  No  Type of Estate agent of [place];Living will Healthcare Power of [place];Living will    Healthcare Power of [place];Living will   Does patient want to make changes to medical advance directive? No - Patient declined No - Patient declined       Copy of Healthcare Power of Attorney in Chart? Yes - validated most recent copy scanned in chart (See row information) Yes - validated most recent copy scanned in chart (See row information)    No - copy requested   Would patient like information on creating a medical advance directive?   No - Patient declined  No - Patient declined  No - Patient declined    Current Medications (verified) Outpatient Encounter Medications as of 03/24/2024  Medication Sig   albuterol  (PROVENTIL  HFA;VENTOLIN  HFA) 108 (90 BASE) MCG/ACT inhaler Inhale 1 puff into the lungs every 6 (six) hours as needed for wheezing or shortness of breath.   Apoaequorin (PREVAGEN PO) Take 1 tablet by mouth daily.   atorvastatin  (LIPITOR) 20 MG  tablet Take 1 tablet (20 mg total) by mouth daily.   cholecalciferol (VITAMIN D3) 25 MCG (1000 UNIT) tablet Take 1,000 Units by mouth daily.   ciclopirox (PENLAC) 8 % solution Apply topically at bedtime. Apply over nail and surrounding skin. Apply daily over previous coat. After seven (7) days, may remove with alcohol and continue cycle.   Coenzyme Q10 (Q-10 CO-ENZYME PO) Take 100 mg by mouth daily.   FARXIGA 10 MG TABS tablet Take 10 mg by mouth daily.   ferrous sulfate 325 (65 FE) MG tablet Take 1 tablet (325 mg total) by mouth 2 (two) times daily with a meal.   fluticasone -salmeterol (ADVAIR) 500-50 MCG/ACT AEPB INHALE 1 PUFF INTO THE LUNGS IN THE MORNING AND AT BEDTIME   glucose blood test strip as directed.   hydrALAZINE  (APRESOLINE ) 100 MG tablet Take 1 tablet (100 mg total) by mouth 2 (two) times daily.   hydrochlorothiazide  (HYDRODIURIL ) 25 MG tablet Take 1 tablet (25 mg total) by mouth daily.   Lancets (ONETOUCH ULTRASOFT) lancets as directed.   losartan  (COZAAR ) 100 MG tablet Take 1 tablet (100 mg total) by mouth every evening.   metFORMIN  (GLUCOPHAGE ) 1000 MG tablet TAKE 1 TABLET(1000 MG) BY MOUTH TWICE DAILY WITH A MEAL   Multiple Vitamin (MULTIVITAMIN WITH MINERALS) TABS tablet Take 1 tablet  by mouth daily.   naproxen sodium (ALEVE) 220 MG tablet Take 220 mg by mouth 2 (two) times daily as needed (pain).   omeprazole  (PRILOSEC) 20 MG capsule Take 1 capsule (20 mg total) by mouth daily before breakfast.   OVER THE COUNTER MEDICATION    No facility-administered encounter medications on file as of 03/24/2024.    Allergies (verified) Amlodipine and Aspirin   History: Past Medical History:  Diagnosis Date   Asthma    Diabetes mellitus without complication (HCC)    GERD (gastroesophageal reflux disease)    High cholesterol    History of colonoscopy    Hx of hypercholesterolemia    PMH   Hypertension    Leg pain    Pneumonia    Past Surgical History:  Procedure Laterality  Date   COLONOSCOPY     DECOMPRESSIVE LUMBAR LAMINECTOMY LEVEL 2 N/A 09/14/2013   Procedure: Lumbar Four-Sacral One Decompression and Transforaminal Lumbar Four-Five Interbody and Lumbar Four-Sacral One Fusion;  Surgeon: Donaciano Sprang, MD;  Location: [facility] OR;  Service: Orthopedics;  Laterality: N/A;   FRACTURE SURGERY     left pinky   LUNG SURGERY     Family History  Problem Relation Age of Onset   Cancer - Colon Mother    Diabetes Mother    Diabetes Father    Heart disease Father    Diabetes Sister    Stroke Brother    Diabetes Brother    Asthma Other    Leukemia Other    Cancer - Prostate Other    Social History   Socioeconomic History   Marital status: Married    Spouse name: Not on file   Number of children: Not on file   Years of education: Not on file   Highest education level: Bachelor's degree (e.g., BA, AB, BS)  Occupational History   Not on file  Tobacco Use   Smoking status: Never   Smokeless tobacco: Never  Substance and Sexual Activity   Alcohol use: No   Drug use: No   Sexual activity: Not on file  Other Topics Concern   Not on file  Social History Narrative   Tobacco use, amount per day now: None   Past tobacco use, amount per day: None   How many years did you use tobacco: None   Alcohol use (drinks per week): None   Diet: None   Do you drink/eat things with caffeine: Diet Soda   Marital status: Married                                 What year were you married? 1970   Do you live in a house, apartment, assisted living, condo, trailer, etc.? House   Is it one or more stories? 1   How many persons live in your home? 2   Do you have pets in your home?( please list) None   Highest Level of education completed? Bachelors   Current or past profession: Computer   Do you exercise?   Yes                               Type and how often? Walk Daily    Do you have a living will? Yes   Do you have a DNR form?  Yes  If not, do you  want to discuss one?   Do you have signed POA/HPOA forms?  Yes                      If so, please bring to you appointment      Do you have any difficulty bathing or dressing yourself? No   Do you have any difficulty preparing food or eating? No   Do you have any difficulty managing your medications? No   Do you have any difficulty managing your finances? No   Do you have any difficulty affording your medications? NO   Social Drivers of Corporate investment banker Strain: Low Risk  (09/01/2023)   Overall Financial Resource Strain (CARDIA)    Difficulty of Paying Living Expenses: Not hard at all  Food Insecurity: No Food Insecurity (03/24/2024)   Hunger Vital Sign    Worried About Running Out of Food in the Last Year: Never true    Ran Out of Food in the Last Year: Never true  Transportation Needs: No Transportation Needs (03/24/2024)   PRAPARE - Administrator, Civil Service (Medical): No    Lack of Transportation (Non-Medical): No  Physical Activity: Sufficiently Active (09/01/2023)   Exercise Vital Sign    Days of Exercise per Week: 7 days    Minutes of Exercise per Session: 40 min  Stress: No Stress Concern Present (09/01/2023)   [employer] of Occupational Health - Occupational Stress Questionnaire    Feeling of Stress : Not at all  Social Connections: Moderately Integrated (09/01/2023)   Social Connection and Isolation Panel    Frequency of Communication with Friends and Family: More than three times a week    Frequency of Social Gatherings with Friends and Family: More than three times a week    Attends Religious Services: 1 to 4 times per year    Active Member of [employer] or Organizations: No    Attends Engineer, structural: Not on file    Marital Status: Married    Tobacco Counseling Counseling given: Not Answered   Clinical Intake:  Pre-visit preparation completed: No  Pain : 0-10 Pain Score: 4  (depends on activity) Pain Type: Chronic  pain Pain Location: Knee (left ankle) Pain Orientation: Right (left ankle) Pain Radiating Towards: No Pain Descriptors / Indicators: Aching, Dull Pain Onset: More than a month ago Pain Frequency: Intermittent Pain Relieving Factors: Topical ointment Effect of Pain on Daily Activities: Kneeling  Pain Relieving Factors: Topical ointment  BMI - recorded: 22.99 Nutritional Status: BMI of 19-24  Normal Diabetes: Yes CBG done?: Yes (166) CBG resulted in Enter/ Edit results?: No Did pt. bring in CBG monitor from home?: No  How often do you need to have someone help you when you read instructions, pamphlets, or other written materials from your doctor or pharmacy?: 1 - Never What is the last grade level you completed in school?: College  Interpreter Needed?: No      Activities of Daily Living    03/24/2024    9:43 AM  In your present state of health, do you have any difficulty performing the following activities:  Hearing? 0  Vision? 0  Difficulty concentrating or making decisions? 0  Walking or climbing stairs? 1  Comment knee pain  Dressing or bathing? 0  Doing errands, shopping? 0  Preparing Food and eating ? N  Using the Toilet? N  In the past six months, have you accidently  leaked urine? N  Do you have problems with loss of bowel control? N  Managing your Medications? N  Managing your Finances? N  Housekeeping or managing your Housekeeping? N    Patient Care Team: Sincerity Cedar C, NP as PCP - General (Family Medicine)  Indicate any recent Medical Services you may have received from other than [facility] providers in the past year (date may be approximate).     Assessment:   This is a routine wellness examination for Lucien.  Hearing/Vision screen Hearing Screening - Comments:: No hearing issues. Vision Screening - Comments:: Eye exam May 2025 no issues ( Dr.Glenn)   Goals Addressed             This Visit's Progress    Patient Stated       Live to 100 yrs         Depression Screen    03/24/2024    9:15 AM 03/07/2024    9:27 AM 09/03/2023    9:20 AM 04/24/2023    3:25 PM 03/05/2023    9:19 AM 09/03/2022    8:46 AM 02/28/2022   11:04 AM  PHQ 2/9 Scores  PHQ - 2 Score 0 0 0 0 0 0 0    Fall Risk    03/24/2024    9:15 AM 03/07/2024    9:27 AM 09/03/2023    9:20 AM 04/24/2023    3:24 PM 03/05/2023    9:19 AM  Fall Risk   Falls in the past year? 0 0 0 0 0  Number falls in past yr: 0 0 0 0   Injury with Fall? 0 0 0 0   Risk for fall due to : No Fall Risks No Fall Risks History of fall(s);No Fall Risks No Fall Risks   Follow up Falls evaluation completed Falls evaluation completed Falls evaluation completed Falls evaluation completed     MEDICARE RISK AT HOME: Medicare Risk at Home Any stairs in or around the home?: No Home free of loose throw rugs in walkways, pet beds, electrical cords, etc?: No Adequate lighting in your home to reduce risk of falls?: Yes Life alert?: No Use of a cane, walker or w/c?: No Grab bars in the bathroom?: Yes Shower chair or bench in shower?: No Elevated toilet seat or a handicapped toilet?: No  TIMED UP AND GO:  Was the test performed?  Yes  Length of time to ambulate 10 feet: 8 sec Gait slow and steady without use of assistive device    Cognitive Function:    03/04/2023    9:50 AM  MMSE - Mini Mental State Exam  Orientation to time 5  Orientation to Place 5  Registration 3  Attention/ Calculation 5  Recall 3  Language- name 2 objects 2  Language- repeat 1  Language- follow 3 step command 3  Language- read & follow direction 1  Write a sentence 1  Copy design 1  Total score 30        03/24/2024    9:15 AM 03/04/2023    9:31 AM  6CIT Screen  What Year? 0 points 0 points  What month? 0 points 0 points  What time? 0 points 0 points  Count back from 20 0 points 0 points  Months in reverse 0 points 0 points  Repeat phrase 2 points 0 points  Total Score 2 points 0 points     Immunizations Immunization History  Administered Date(s) Administered   DTaP 12/26/2022   Fluad Quad(high  Dose 65+) 02/26/2022   INFLUENZA, HIGH DOSE SEASONAL PF 03/22/2023   Influenza, Seasonal, Injecte, Preservative Fre 03/24/2011   Influenza,inj,Quad PF,6+ Mos 03/18/2017, 02/17/2018, 02/09/2019, 04/10/2020   Influenza,inj,quad, With Preservative 03/29/2018   Influenza-Unspecified 06/03/1999, 04/17/2010, 03/19/2015, 05/02/2016, 03/19/2021, 03/06/2024   Moderna Covid-19 Fall Seasonal Vaccine 64yrs & older 08/22/2022, 01/10/2023   Moderna SARS-COV2 Booster Vaccination 03/19/2024   PFIZER Comirnaty(Gray Top)Covid-19 Tri-Sucrose Vaccine 09/11/2020   PFIZER(Purple Top)SARS-COV-2 Vaccination 09/22/2019, 10/17/2019, 04/18/2020   Pfizer Covid-19 Vaccine Bivalent Booster 34yrs & up 03/19/2021, 01/02/2022   Pneumococcal Conjugate-13 06/03/2015   Pneumococcal Polysaccharide-23 04/17/2010, 08/05/2016   Pneumococcal-Unspecified 07/14/2016   Respiratory Syncytial Virus Vaccine,Recomb Aduvanted(Arexvy) 05/19/2022   Td (Adult),unspecified 01/07/2013   Tdap 10/03/2022   Zoster Recombinant(Shingrix) 10/11/2022, 12/02/2022, 12/27/2022    TDAP status: Up to date  Flu Vaccine status: Up to date  Pneumococcal vaccine status: Up to date  Covid-19 vaccine status: Information provided on how to obtain vaccines.   Qualifies for Shingles Vaccine? Yes   Zostavax completed No   Shingrix Completed?: Yes  Screening Tests Health Maintenance  Topic Date Due   FOOT EXAM  03/04/2024   COVID-19 Vaccine (9 - 2025-26 season) 05/14/2024   HEMOGLOBIN A1C  08/31/2024   OPHTHALMOLOGY EXAM  10/28/2024   Diabetic kidney evaluation - eGFR measurement  03/02/2025   Diabetic kidney evaluation - Urine ACR  03/07/2025   Medicare Annual Wellness (AWV)  03/24/2025   DTaP/Tdap/Td (3 - Td or Tdap) 12/25/2032   Pneumococcal Vaccine: 50+ Years  Completed   Influenza Vaccine  Completed   Hepatitis C Screening   Completed   Zoster Vaccines- Shingrix  Completed   Meningococcal B Vaccine  Aged Out    Health Maintenance  Health Maintenance Due  Topic Date Due   FOOT EXAM  03/04/2024    Colorectal cancer screening: No longer required.   Lung Cancer Screening: (Low Dose CT Chest recommended if Age 49-80 years, 20 pack-year currently smoking OR have quit w/in 15years.) does not qualify.   Lung Cancer Screening Referral: N/A   Additional Screening:  Hepatitis C Screening: does qualify; Completed Yes   Vision Screening: Recommended annual ophthalmology exams for early detection of glaucoma and other disorders of the eye. Is the patient up to date with their annual eye exam?  Yes  Who is the provider or what is the name of the office in which the patient attends annual eye exams? Dr.Glenn If pt is not established with a provider, would they like to be referred to a provider to establish care? No .   Dental Screening: Recommended annual dental exams for proper oral hygiene  Diabetic Foot Exam: Exam 03/07/2024  Community Resource Referral / Chronic Care Management: CRR required this visit?  No   CCM required this visit?  No     Plan:     I have personally reviewed and noted the following in the patient's chart:   Medical and social history Use of alcohol, tobacco or illicit drugs  Current medications and supplements including opioid prescriptions. Patient is not currently taking opioid prescriptions. Functional ability and status Nutritional status Physical activity Advanced directives List of other physicians Hospitalizations, surgeries, and ER visits in previous 12 months Vitals Screenings to include cognitive, depression, and falls Referrals and appointments  In addition, I have reviewed and discussed with patient certain preventive protocols, quality metrics, and best practice recommendations. A written personalized care plan for preventive services as well as general  preventive health recommendations were provided to patient.  Roxan JAYSON Plough, NP   03/24/2024   After Visit Summary: (In Person-Printed) AVS printed and given to the patient  Nurse Notes: Up to date

## 2024-03-31 DIAGNOSIS — D2261 Melanocytic nevi of right upper limb, including shoulder: Secondary | ICD-10-CM | POA: Diagnosis not present

## 2024-03-31 DIAGNOSIS — D2272 Melanocytic nevi of left lower limb, including hip: Secondary | ICD-10-CM | POA: Diagnosis not present

## 2024-03-31 DIAGNOSIS — D2262 Melanocytic nevi of left upper limb, including shoulder: Secondary | ICD-10-CM | POA: Diagnosis not present

## 2024-03-31 DIAGNOSIS — D225 Melanocytic nevi of trunk: Secondary | ICD-10-CM | POA: Diagnosis not present

## 2024-05-04 DIAGNOSIS — K080 Exfoliation of teeth due to systemic causes: Secondary | ICD-10-CM | POA: Diagnosis not present

## 2024-05-07 ENCOUNTER — Other Ambulatory Visit: Payer: Self-pay | Admitting: Family

## 2024-05-07 DIAGNOSIS — J4520 Mild intermittent asthma, uncomplicated: Secondary | ICD-10-CM

## 2024-08-31 ENCOUNTER — Other Ambulatory Visit: Payer: Self-pay

## 2024-09-05 ENCOUNTER — Ambulatory Visit: Payer: Self-pay | Admitting: Family

## 2025-03-27 ENCOUNTER — Ambulatory Visit: Payer: Self-pay | Admitting: Family
